# Patient Record
Sex: Female | Born: 1960 | Race: Black or African American | Hispanic: No | Marital: Married | Smoking: Never smoker
Health system: Southern US, Community
[De-identification: ages and names within clinical notes are randomized; demographics above are authoritative.]

## PROBLEM LIST (undated history)

## (undated) DIAGNOSIS — R059 Cough, unspecified: Secondary | ICD-10-CM

## (undated) DIAGNOSIS — R05 Cough: Secondary | ICD-10-CM

## (undated) DIAGNOSIS — R198 Other specified symptoms and signs involving the digestive system and abdomen: Secondary | ICD-10-CM

## (undated) DIAGNOSIS — E785 Hyperlipidemia, unspecified: Secondary | ICD-10-CM

## (undated) DIAGNOSIS — R09A2 Foreign body sensation, throat: Secondary | ICD-10-CM

## (undated) DIAGNOSIS — R070 Pain in throat: Secondary | ICD-10-CM

## (undated) DIAGNOSIS — R0989 Other specified symptoms and signs involving the circulatory and respiratory systems: Secondary | ICD-10-CM

## (undated) HISTORY — DX: Hyperlipidemia, unspecified: E78.5

## (undated) HISTORY — PX: DILATION AND CURETTAGE OF UTERUS: SHX78

## (undated) HISTORY — PX: DILATION AND CURETTAGE, DIAGNOSTIC / THERAPEUTIC: SUR384

## (undated) HISTORY — DX: Pain in throat: R07.0

## (undated) HISTORY — DX: Other specified symptoms and signs involving the digestive system and abdomen: R19.8

## (undated) HISTORY — DX: Other specified symptoms and signs involving the circulatory and respiratory systems: R09.89

## (undated) HISTORY — DX: Foreign body sensation, throat: R09.A2

---
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
## 1997-05-18 ENCOUNTER — Inpatient Hospital Stay (HOSPITAL_COMMUNITY): Admission: AD | Admit: 1997-05-18 | Discharge: 1997-05-18 | Payer: Self-pay | Admitting: *Deleted

## 1997-10-09 ENCOUNTER — Inpatient Hospital Stay (HOSPITAL_COMMUNITY): Admission: AD | Admit: 1997-10-09 | Discharge: 1997-10-11 | Payer: Self-pay | Admitting: Obstetrics and Gynecology

## 1997-10-19 ENCOUNTER — Encounter (HOSPITAL_COMMUNITY): Admission: RE | Admit: 1997-10-19 | Discharge: 1998-01-17 | Payer: Self-pay | Admitting: Obstetrics and Gynecology

## 1997-11-19 ENCOUNTER — Other Ambulatory Visit: Admission: RE | Admit: 1997-11-19 | Discharge: 1997-11-19 | Payer: Self-pay | Admitting: Obstetrics and Gynecology

## 1998-01-11 ENCOUNTER — Ambulatory Visit (HOSPITAL_COMMUNITY): Admission: RE | Admit: 1998-01-11 | Discharge: 1998-01-11 | Payer: Self-pay | Admitting: Obstetrics and Gynecology

## 1998-01-19 ENCOUNTER — Encounter (HOSPITAL_COMMUNITY): Admission: RE | Admit: 1998-01-19 | Discharge: 1998-04-19 | Payer: Self-pay | Admitting: *Deleted

## 1998-04-20 ENCOUNTER — Encounter (HOSPITAL_COMMUNITY): Admission: RE | Admit: 1998-04-20 | Discharge: 1998-07-19 | Payer: Self-pay | Admitting: *Deleted

## 1998-07-21 ENCOUNTER — Encounter (HOSPITAL_COMMUNITY): Admission: RE | Admit: 1998-07-21 | Discharge: 1998-10-19 | Payer: Self-pay | Admitting: Obstetrics and Gynecology

## 1999-02-02 ENCOUNTER — Encounter: Admission: RE | Admit: 1999-02-02 | Discharge: 1999-02-02 | Payer: Self-pay | Admitting: Obstetrics and Gynecology

## 1999-02-02 ENCOUNTER — Encounter: Payer: Self-pay | Admitting: Obstetrics and Gynecology

## 2001-01-03 ENCOUNTER — Other Ambulatory Visit: Admission: RE | Admit: 2001-01-03 | Discharge: 2001-01-03 | Payer: Self-pay | Admitting: Obstetrics and Gynecology

## 2002-01-27 ENCOUNTER — Other Ambulatory Visit: Admission: RE | Admit: 2002-01-27 | Discharge: 2002-01-27 | Payer: Self-pay | Admitting: Obstetrics and Gynecology

## 2005-01-12 ENCOUNTER — Encounter: Admission: RE | Admit: 2005-01-12 | Discharge: 2005-01-12 | Payer: Self-pay | Admitting: Obstetrics and Gynecology

## 2007-01-24 ENCOUNTER — Encounter: Admission: RE | Admit: 2007-01-24 | Discharge: 2007-01-24 | Payer: Self-pay | Admitting: Obstetrics and Gynecology

## 2008-03-02 ENCOUNTER — Encounter: Admission: RE | Admit: 2008-03-02 | Discharge: 2008-03-02 | Payer: Self-pay | Admitting: Obstetrics and Gynecology

## 2009-03-08 ENCOUNTER — Encounter: Admission: RE | Admit: 2009-03-08 | Discharge: 2009-03-08 | Payer: Self-pay | Admitting: Obstetrics and Gynecology

## 2009-08-26 ENCOUNTER — Encounter: Admission: RE | Admit: 2009-08-26 | Discharge: 2009-08-26 | Payer: Self-pay | Admitting: Obstetrics and Gynecology

## 2010-02-14 ENCOUNTER — Other Ambulatory Visit: Payer: Self-pay | Admitting: Obstetrics and Gynecology

## 2010-02-14 DIAGNOSIS — Z1239 Encounter for other screening for malignant neoplasm of breast: Secondary | ICD-10-CM

## 2010-03-09 ENCOUNTER — Ambulatory Visit
Admission: RE | Admit: 2010-03-09 | Discharge: 2010-03-09 | Disposition: A | Discharge status: Home / Self Care | Payer: Managed Care, Other (non HMO) | Source: Ambulatory Visit | Attending: Obstetrics and Gynecology | Admitting: Obstetrics and Gynecology

## 2010-03-09 DIAGNOSIS — Z1239 Encounter for other screening for malignant neoplasm of breast: Secondary | ICD-10-CM

## 2011-02-20 ENCOUNTER — Other Ambulatory Visit: Payer: Self-pay | Admitting: Obstetrics and Gynecology

## 2011-02-20 DIAGNOSIS — Z1231 Encounter for screening mammogram for malignant neoplasm of breast: Secondary | ICD-10-CM

## 2011-03-14 ENCOUNTER — Ambulatory Visit
Admission: RE | Admit: 2011-03-14 | Discharge: 2011-03-14 | Disposition: A | Discharge status: Home / Self Care | Payer: Managed Care, Other (non HMO) | Source: Ambulatory Visit | Attending: Obstetrics and Gynecology | Admitting: Obstetrics and Gynecology

## 2011-03-14 DIAGNOSIS — Z1231 Encounter for screening mammogram for malignant neoplasm of breast: Secondary | ICD-10-CM

## 2012-03-12 ENCOUNTER — Other Ambulatory Visit: Payer: Self-pay | Admitting: Obstetrics and Gynecology

## 2012-03-12 DIAGNOSIS — Z1231 Encounter for screening mammogram for malignant neoplasm of breast: Secondary | ICD-10-CM

## 2012-04-09 ENCOUNTER — Ambulatory Visit
Admission: RE | Admit: 2012-04-09 | Discharge: 2012-04-09 | Disposition: A | Discharge status: Home / Self Care | Payer: Private Health Insurance - Indemnity | Source: Ambulatory Visit | Attending: Obstetrics and Gynecology | Admitting: Obstetrics and Gynecology

## 2012-04-09 DIAGNOSIS — Z1231 Encounter for screening mammogram for malignant neoplasm of breast: Secondary | ICD-10-CM

## 2013-02-26 ENCOUNTER — Other Ambulatory Visit: Payer: Self-pay

## 2013-02-26 DIAGNOSIS — Z1231 Encounter for screening mammogram for malignant neoplasm of breast: Secondary | ICD-10-CM

## 2013-04-10 ENCOUNTER — Ambulatory Visit
Admission: RE | Admit: 2013-04-10 | Discharge: 2013-04-10 | Disposition: A | Discharge status: Home / Self Care | Payer: Self-pay | Source: Ambulatory Visit

## 2013-04-10 DIAGNOSIS — Z1231 Encounter for screening mammogram for malignant neoplasm of breast: Secondary | ICD-10-CM

## 2015-11-29 NOTE — Progress Notes (Signed)
 Cardiology Office Note    Date:  11/30/2015   ID:  Michele Mcdaniel, DOB 09/04/1960, MRN [id number]  PCP:  VIA, KEVIN, MD  Cardiologist:  New --> Dr. Varanasi.  CC: Chest pain  History of Present Illness:  Michele Mcdaniel is a 55 y.o. female with a history of HLD and no prior cardiac history who presents to clinic for evaluation of chest pain and palpitations.   She has not followed with a PCP for a while. Did follow with her OB/GYN, Dr. Taavon.   Established care with Dr Via with [facility] on 11/25/15 for chest pressure.   Labwork from that visit reviewed: H/H 13.9/41.9. Creat 0.86.NA 142, K 4.5, AST/ALT 11/12, TC 243, TG 63, HDL 83, LDL 147, TSH 1.2. ECG showed HR 63 NSR with no acute ST/TW changes.   She was in her usual state of health until about 6-9 months ago. She has noticed decreased exercise tolerance. She works with [employer] and would love to walk around the track after work or walk on her treadmill. She has noticed exertional chest burning and SOB that is worsening in frequency and severity. She feels like she cannot walk even small distances with out feeling SOB or experiencing chest pain. Sometimes is radiates to her right arm. No LE edema, orthopnea or PND. No dizziness or syncope. Sometimes she feels like her heart is beating "hard" when she gets the chest pain.    Past Medical History:  Diagnosis Date  . HLD (hyperlipidemia)     No past surgical history on file.  Current Medications: No outpatient prescriptions prior to visit.   No facility-administered medications prior to visit.      Allergies:   Patient has no known allergies.   Social History   Social History  . Marital status: Married    Spouse name: N/A  . Number of children: N/A  . Years of education: N/A   Social History Main Topics  . Smoking status: Not on file  . Smokeless tobacco: Not on file  . Alcohol use Not on file  . Drug use: Unknown  . Sexual activity:  Not on file   Other Topics Concern  . Not on file   Social History Narrative  . No narrative on file     Family History:  The patient's family history includes Heart attack in her father; Heart failure in her mother; Hypertension in her brother; Peripheral Artery Disease in her mother.     ROS:   Please see the history of present illness.    ROS All other systems reviewed and are negative.   PHYSICAL EXAM:   VS:  BP 118/72 (BP Location: Right Arm, Patient Position: Sitting, Cuff Size: Normal)   Pulse 70   Ht 5' 6.25" (1.683 m)   Wt 161 lb 1.9 oz (73.1 kg)   SpO2 95%   BMI 25.81 kg/m    GEN: Well nourished, well developed, in no acute distress  HEENT: normal  Neck: no JVD, carotid bruits, or masses Cardiac: RRR; no murmurs, rubs, or gallops,no edema  Respiratory:  clear to auscultation bilaterally, normal work of breathing GI: soft, nontender, nondistended, + BS MS: no deformity or atrophy  Skin: warm and dry, no rash Neuro:  Alert and Oriented x 3, Strength and sensation are intact Psych: euthymic mood, full affect  Wt Readings from Last 3 Encounters:  11/30/15 161 lb 1.9 oz (73.1 kg)      Studies/Labs Reviewed:     EKG:  EKG is ordered today.  The ekg ordered today demonstrates NSR   Recent Labs: No results found for requested labs within last 8760 hours.   Lipid Panel No results found for: CHOL, TRIG, HDL, CHOLHDL, VLDL, LDLCALC, LDLDIRECT  Additional studies/ records that were reviewed today include:  None    ASSESSMENT & PLAN:   Exertional chest pain and SOB: this is very concerning for cardiac angina. She has RFs including HLD and family history of CAD. ECG with no acute ST or TW changes. Discussed case with Dr. Varanasi who saw patient with me . We feel cardiac catheterization is indicated. Will start her on ASA, statin and imdur 30 mg daily. Cath planned for next Tuesday with Dr. Smith. If imdur does not improve her sx, I will start a BB.   I have  reviewed the risks, indications, and alternatives to cardiac catheterization and possible angioplasty/stenting with the patient. Risks include but are not limited to bleeding, infection, vascular injury, stroke, myocardial infection, arrhythmia, kidney injury, radiation-related injury in the case of prolonged fluoroscopy use, emergency cardiac surgery, and death. The patient understands the risks of serious complication is low (<1%).   I will get pre cath labs today. She may be a candidate for same day PCI.    HLD: LDL 146. If she does have CAD, goal <70. Will start atorva 80mg daily.   Total time spent with patient was over 40 minutes which included evaluating patient, reviewing record and coordinating care. Face to face time >50%. Discussed case with Dr. Varanasi, DOD. We discussed together and with patient.   Medication Adjustments/Labs and Tests Ordered: Current medicines are reviewed at length with the patient today.  Concerns regarding medicines are outlined above.  Medication changes, Labs and Tests ordered today are listed in the Patient Instructions below. Patient Instructions  Medication Instructions:  Your physician has recommended you make the following change in your medication:  1.  START Imdur 30 mg taking 1 tablet daily 2.  START Aspirin 81 mg taking 1 daily 3.  START Atorvastatin 80 mg taking 1 tablet daily  Labwork: TODAY:  BMET, CBC W/DIFF & PT/INR  Testing/Procedures: Your physician has requested that you have a cardiac catheterization. Cardiac catheterization is used to diagnose and/or treat various heart conditions. Doctors may recommend this procedure for a number of different reasons. The most common reason is to evaluate chest pain. Chest pain can be a symptom of coronary artery disease (CAD), and cardiac catheterization can show whether plaque is narrowing or blocking your heart's arteries. This procedure is also used to evaluate the valves, as well as measure the  blood flow and oxygen levels in different parts of your heart. For further information please visit [email or web address]. Please follow instruction sheet, as given.   Follow-Up: Your physician recommends that you schedule a follow-up appointment in:    Any Other Special Instructions Will Be Listed Below (If Applicable).    Coronary Angiogram With Stent Coronary angiogram with stent placement is a procedure to widen or open a narrow blood vessel of the heart (coronary artery). Arteries may become blocked by cholesterol buildup (plaques) in the lining or wall. When a coronary artery becomes partially blocked, blood flow to that area decreases. This may lead to chest pain or a heart attack (myocardial infarction). A stent is a small piece of metal that looks like mesh or a spring. Stent placement may be done as treatment for a heart attack or right after a coronary angiogram   in which a blocked artery is found. Let your health care provider know about:  Any allergies you have.  All medicines you are taking, including vitamins, herbs, eye drops, creams, and over-the-counter medicines.  Any problems you or family members have had with anesthetic medicines.  Any blood disorders you have.  Any surgeries you have had.  Any medical conditions you have.  Whether you are pregnant or may be pregnant. What are the risks? Generally, this is a safe procedure. However, problems may occur, including:  Damage to the heart or its blood vessels.  A return of blockage.  Bleeding, infection, or bruising at the insertion site.  A collection of blood under the skin (hematoma) at the insertion site.  A blood clot in another part of the body.  Kidney injury.  Allergic reaction to the dye or contrast that is used.  Bleeding into the abdomen (retroperitoneal bleeding). What happens before the procedure? Staying hydrated  Follow instructions from your health care provider about hydration, which may  include:  Up to 2 hours before the procedure - you may continue to drink clear liquids, such as water, clear fruit juice, black coffee, and plain tea. Eating and drinking restrictions  Follow instructions from your health care provider about eating and drinking, which may include:  8 hours before the procedure - stop eating heavy meals or foods such as meat, fried foods, or fatty foods.  6 hours before the procedure - stop eating light meals or foods, such as toast or cereal.  2 hours before the procedure - stop drinking clear liquids. Ask your health care provider about:  Changing or stopping your regular medicines. This is especially important if you are taking diabetes medicines or blood thinners.  Taking medicines such as ibuprofen. These medicines can thin your blood. Do not take these medicines before your procedure if your health care provider instructs you not to. Generally, aspirin is recommended before a procedure of passing a small, thin tube (catheter) through a blood vessel and into the heart (cardiac catheterization). What happens during the procedure?  An IV tube will be inserted into one of your veins.  You will be given one or more of the following:  A medicine to help you relax (sedative).  A medicine to numb the area where the catheter will be inserted into an artery (local anesthetic).  To reduce your risk of infection:  Your health care team will wash or sanitize their hands.  Your skin will be washed with soap.  Hair may be removed from the area where the catheter will be inserted.  Using a guide wire, the catheter will be inserted into an artery. The location may be in your groin, in your wrist, or in the fold of your arm (near your elbow).  A type of X-ray (fluoroscopy) will be used to help guide the catheter to the opening of the arteries in the heart.  A dye will be injected into the catheter, and X-rays will be taken. The dye will help to show where  any narrowing or blockages are located in the arteries.  A tiny wire will be guided to the blocked spot, and a balloon will be inflated to make the artery wider.  The stent will be expanded and will crush the plaques into the wall of the vessel. The stent will hold the area open and improve the blood flow. Most stents have a drug coating to reduce the risk of the stent narrowing over time.    The artery may be made wider using a drill, laser, or other tools to remove plaques.  When the blood flow is better, the catheter will be removed. The lining of the artery will grow over the stent, which stays where it was placed. This procedure may vary among health care providers and hospitals. What happens after the procedure?  If the procedure is done through the leg, you will be kept in bed lying flat for about 6 hours. You will be instructed to not bend and not cross your legs.  The insertion site will be checked frequently.  The pulse in your foot or wrist will be checked frequently.  You may have additional blood tests, X-rays, and a test that records the electrical activity of your heart (electrocardiogram, or ECG). This information is not intended to replace advice given to you by your health care provider. Make sure you discuss any questions you have with your health care provider. Document Released: 07/09/2002 Document Revised: 09/02/2015 Document Reviewed: 08/08/2015 Elsevier Interactive Patient Education  2017 [employer]     Signed, Ricardo Schubach, PA-C  11/30/2015 12:26 PM    [facility] [address], [place], [place]  [address] Phone: [phone]; Fax: [phone]    

## 2015-11-30 ENCOUNTER — Encounter: Payer: Self-pay | Admitting: Physician Assistant

## 2015-11-30 ENCOUNTER — Ambulatory Visit (INDEPENDENT_AMBULATORY_CARE_PROVIDER_SITE_OTHER): Payer: Private Health Insurance - Indemnity | Admitting: Physician Assistant

## 2015-11-30 ENCOUNTER — Encounter (INDEPENDENT_AMBULATORY_CARE_PROVIDER_SITE_OTHER): Payer: Self-pay

## 2015-11-30 ENCOUNTER — Encounter: Payer: Self-pay | Admitting: *Deleted

## 2015-11-30 VITALS — BP 118/72 | HR 70 | Ht 66.25 in | Wt 161.1 lb

## 2015-11-30 DIAGNOSIS — R079 Chest pain, unspecified: Secondary | ICD-10-CM | POA: Diagnosis not present

## 2015-11-30 DIAGNOSIS — E785 Hyperlipidemia, unspecified: Secondary | ICD-10-CM | POA: Diagnosis not present

## 2015-11-30 DIAGNOSIS — R0602 Shortness of breath: Secondary | ICD-10-CM

## 2015-11-30 LAB — CBC WITH DIFFERENTIAL/PLATELET
BASOS PCT: 0 %
Basophils Absolute: 0 cells/uL (ref 0–200)
EOS ABS: 51 {cells}/uL (ref 15–500)
Eosinophils Relative: 1 %
HEMATOCRIT: 41 % (ref 35.0–45.0)
Hemoglobin: 13.1 g/dL (ref 11.7–15.5)
Lymphocytes Relative: 46 %
Lymphs Abs: 2346 cells/uL (ref 850–3900)
MCH: 23.1 pg — ABNORMAL LOW (ref 27.0–33.0)
MCHC: 32 g/dL (ref 32.0–36.0)
MCV: 72.4 fL — ABNORMAL LOW (ref 80.0–100.0)
MONO ABS: 255 {cells}/uL (ref 200–950)
MPV: 9.3 fL (ref 7.5–12.5)
Monocytes Relative: 5 %
Neutro Abs: 2448 cells/uL (ref 1500–7800)
Neutrophils Relative %: 48 %
Platelets: 258 10*3/uL (ref 140–400)
RBC: 5.66 MIL/uL — AB (ref 3.80–5.10)
RDW: 16.6 % — ABNORMAL HIGH (ref 11.0–15.0)
WBC: 5.1 10*3/uL (ref 3.8–10.8)

## 2015-11-30 LAB — BASIC METABOLIC PANEL
BUN: 17 mg/dL (ref 7–25)
CHLORIDE: 105 mmol/L (ref 98–110)
CO2: 26 mmol/L (ref 20–31)
Calcium: 9.7 mg/dL (ref 8.6–10.4)
Creat: 0.82 mg/dL (ref 0.50–1.05)
GLUCOSE: 86 mg/dL (ref 65–99)
Potassium: 4.5 mmol/L (ref 3.5–5.3)
SODIUM: 141 mmol/L (ref 135–146)

## 2015-11-30 LAB — PROTIME-INR
INR: 1.1
Prothrombin Time: 11.2 s (ref 9.0–11.5)

## 2015-11-30 MED ORDER — ISOSORBIDE MONONITRATE ER 30 MG PO TB24: 30.0000 mg | ORAL_TABLET | Freq: Every day | ORAL | 3 refills | Status: DC

## 2015-11-30 MED ORDER — ASPIRIN EC 81 MG PO TBEC: 81.0000 mg | DELAYED_RELEASE_TABLET | Freq: Every day | ORAL | 3 refills | Status: DC

## 2015-11-30 MED ORDER — ATORVASTATIN CALCIUM 80 MG PO TABS: 80.0000 mg | ORAL_TABLET | Freq: Every day | ORAL | 3 refills | Status: DC

## 2015-11-30 NOTE — Patient Instructions (Addendum)
Medication Instructions:  Your physician has recommended you make the following change in your medication:  1.  START Imdur 30 mg taking 1 tablet daily 2.  START Aspirin 81 mg taking 1 daily 3.  START Atorvastatin 80 mg taking 1 tablet daily  Labwork: TODAY:  BMET, CBC W/DIFF & PT/INR  Testing/Procedures: Your physician has requested that you have a cardiac catheterization. Cardiac catheterization is used to diagnose and/or treat various heart conditions. Doctors may recommend this procedure for a number of different reasons. The most common reason is to evaluate chest pain. Chest pain can be a symptom of coronary artery disease (CAD), and cardiac catheterization can show whether plaque is narrowing or blocking your heart's arteries. This procedure is also used to evaluate the valves, as well as measure the blood flow and oxygen levels in different parts of your heart. For further information please visit [email or web address]. Please follow instruction sheet, as given.   Follow-Up: Your physician recommends that you schedule a follow-up appointment in:    Any Other Special Instructions Will Be Listed Below (If Applicable).    Coronary Angiogram With Stent Coronary angiogram with stent placement is a procedure to widen or open a narrow blood vessel of the heart (coronary artery). Arteries may become blocked by cholesterol buildup (plaques) in the lining or wall. When a coronary artery becomes partially blocked, blood flow to that area decreases. This may lead to chest pain or a heart attack (myocardial infarction). A stent is a small piece of metal that looks like mesh or a spring. Stent placement may be done as treatment for a heart attack or right after a coronary angiogram in which a blocked artery is found. Let your health care provider know about:  Any allergies you have.  All medicines you are taking, including vitamins, herbs, eye drops, creams, and over-the-counter medicines.  Any  problems you or family members have had with anesthetic medicines.  Any blood disorders you have.  Any surgeries you have had.  Any medical conditions you have.  Whether you are pregnant or may be pregnant. What are the risks? Generally, this is a safe procedure. However, problems may occur, including:  Damage to the heart or its blood vessels.  A return of blockage.  Bleeding, infection, or bruising at the insertion site.  A collection of blood under the skin (hematoma) at the insertion site.  A blood clot in another part of the body.  Kidney injury.  Allergic reaction to the dye or contrast that is used.  Bleeding into the abdomen (retroperitoneal bleeding). What happens before the procedure? Staying hydrated  Follow instructions from your health care provider about hydration, which may include:  Up to 2 hours before the procedure - you may continue to drink clear liquids, such as water, clear fruit juice, black coffee, and plain tea. Eating and drinking restrictions  Follow instructions from your health care provider about eating and drinking, which may include:  8 hours before the procedure - stop eating heavy meals or foods such as meat, fried foods, or fatty foods.  6 hours before the procedure - stop eating light meals or foods, such as toast or cereal.  2 hours before the procedure - stop drinking clear liquids. Ask your health care provider about:  Changing or stopping your regular medicines. This is especially important if you are taking diabetes medicines or blood thinners.  Taking medicines such as ibuprofen. These medicines can thin your blood. Do not take these medicines before your  procedure if your health care provider instructs you not to. Generally, aspirin is recommended before a procedure of passing a small, thin tube (catheter) through a blood vessel and into the heart (cardiac catheterization). What happens during the procedure?  An IV tube will be  inserted into one of your veins.  You will be given one or more of the following:  A medicine to help you relax (sedative).  A medicine to numb the area where the catheter will be inserted into an artery (local anesthetic).  To reduce your risk of infection:  Your health care team will wash or sanitize their hands.  Your skin will be washed with soap.  Hair may be removed from the area where the catheter will be inserted.  Using a guide wire, the catheter will be inserted into an artery. The location may be in your groin, in your wrist, or in the fold of your arm (near your elbow).  A type of X-ray (fluoroscopy) will be used to help guide the catheter to the opening of the arteries in the heart.  A dye will be injected into the catheter, and X-rays will be taken. The dye will help to show where any narrowing or blockages are located in the arteries.  A tiny wire will be guided to the blocked spot, and a balloon will be inflated to make the artery wider.  The stent will be expanded and will crush the plaques into the wall of the vessel. The stent will hold the area open and improve the blood flow. Most stents have a drug coating to reduce the risk of the stent narrowing over time.  The artery may be made wider using a drill, laser, or other tools to remove plaques.  When the blood flow is better, the catheter will be removed. The lining of the artery will grow over the stent, which stays where it was placed. This procedure may vary among health care providers and hospitals. What happens after the procedure?  If the procedure is done through the leg, you will be kept in bed lying flat for about 6 hours. You will be instructed to not bend and not cross your legs.  The insertion site will be checked frequently.  The pulse in your foot or wrist will be checked frequently.  You may have additional blood tests, X-rays, and a test that records the electrical activity of your heart  (electrocardiogram, or ECG). This information is not intended to replace advice given to you by your health care provider. Make sure you discuss any questions you have with your health care provider. Document Released: 07/09/2002 Document Revised: 09/02/2015 Document Reviewed: 08/08/2015 Elsevier Interactive Patient Education  2017 [employer]

## 2015-12-07 ENCOUNTER — Ambulatory Visit (HOSPITAL_COMMUNITY)
Admission: RE | Admit: 2015-12-07 | Discharge: 2015-12-07 | Disposition: A | Discharge status: Home / Self Care | Payer: Managed Care, Other (non HMO) | Source: Ambulatory Visit | Attending: Interventional Cardiology | Admitting: Interventional Cardiology

## 2015-12-07 ENCOUNTER — Encounter (HOSPITAL_COMMUNITY)
Admission: RE | Disposition: A | Discharge status: Home / Self Care | Payer: Self-pay | Source: Ambulatory Visit | Attending: Interventional Cardiology

## 2015-12-07 ENCOUNTER — Encounter (HOSPITAL_COMMUNITY): Payer: Self-pay | Admitting: *Deleted

## 2015-12-07 DIAGNOSIS — I200 Unstable angina: Secondary | ICD-10-CM

## 2015-12-07 DIAGNOSIS — R0602 Shortness of breath: Secondary | ICD-10-CM | POA: Insufficient documentation

## 2015-12-07 DIAGNOSIS — R002 Palpitations: Secondary | ICD-10-CM | POA: Diagnosis not present

## 2015-12-07 DIAGNOSIS — Z8249 Family history of ischemic heart disease and other diseases of the circulatory system: Secondary | ICD-10-CM | POA: Diagnosis not present

## 2015-12-07 DIAGNOSIS — E785 Hyperlipidemia, unspecified: Secondary | ICD-10-CM | POA: Insufficient documentation

## 2015-12-07 DIAGNOSIS — R079 Chest pain, unspecified: Secondary | ICD-10-CM

## 2015-12-07 DIAGNOSIS — R0789 Other chest pain: Secondary | ICD-10-CM | POA: Diagnosis not present

## 2015-12-07 HISTORY — PX: CARDIAC CATHETERIZATION: SHX172

## 2015-12-07 SURGERY — LEFT HEART CATH AND CORONARY ANGIOGRAPHY
Anesthesia: LOCAL

## 2015-12-07 MED ORDER — HEPARIN SODIUM (PORCINE) 1000 UNIT/ML IJ SOLN
INTRAMUSCULAR | Status: DC | PRN
  Administered 2015-12-07: 3500 [IU] via INTRAVENOUS

## 2015-12-07 MED ORDER — MIDAZOLAM HCL 2 MG/2ML IJ SOLN
INTRAMUSCULAR | Status: DC | PRN
  Administered 2015-12-07 (×2): 1 mg via INTRAVENOUS

## 2015-12-07 MED ORDER — HEPARIN SODIUM (PORCINE) 1000 UNIT/ML IJ SOLN
INTRAMUSCULAR | Status: AC
  Filled 2015-12-07: qty 1

## 2015-12-07 MED ORDER — LIDOCAINE HCL (PF) 1 % IJ SOLN
INTRAMUSCULAR | Status: AC
  Filled 2015-12-07: qty 30

## 2015-12-07 MED ORDER — VERAPAMIL HCL 2.5 MG/ML IV SOLN
INTRAVENOUS | Status: AC
  Filled 2015-12-07: qty 2

## 2015-12-07 MED ORDER — HEPARIN (PORCINE) IN NACL 2-0.9 UNIT/ML-% IJ SOLN
INTRAMUSCULAR | Status: AC
  Filled 2015-12-07: qty 1000

## 2015-12-07 MED ORDER — LIDOCAINE HCL (PF) 1 % IJ SOLN
INTRAMUSCULAR | Status: DC | PRN
  Administered 2015-12-07: 2 mL via INTRADERMAL

## 2015-12-07 MED ORDER — SODIUM CHLORIDE 0.9% FLUSH: 3.0000 mL | Freq: Two times a day (BID) | INTRAVENOUS | Status: DC

## 2015-12-07 MED ORDER — SODIUM CHLORIDE 0.9 % WEIGHT BASED INFUSION
3.0000 mL/kg/h | INTRAVENOUS | Status: AC
Start: 2015-12-07 — End: 2015-12-07
  Administered 2015-12-07: 3 mL/kg/h via INTRAVENOUS

## 2015-12-07 MED ORDER — IOPAMIDOL (ISOVUE-370) INJECTION 76%
INTRAVENOUS | Status: AC
  Filled 2015-12-07: qty 100

## 2015-12-07 MED ORDER — MIDAZOLAM HCL 2 MG/2ML IJ SOLN
INTRAMUSCULAR | Status: AC
  Filled 2015-12-07: qty 2

## 2015-12-07 MED ORDER — FENTANYL CITRATE (PF) 100 MCG/2ML IJ SOLN
INTRAMUSCULAR | Status: AC
  Filled 2015-12-07: qty 2

## 2015-12-07 MED ORDER — HEPARIN (PORCINE) IN NACL 2-0.9 UNIT/ML-% IJ SOLN
INTRAMUSCULAR | Status: DC | PRN
  Administered 2015-12-07: 1000 mL via INTRA_ARTERIAL

## 2015-12-07 MED ORDER — SODIUM CHLORIDE 0.9 % IV SOLN: 250.0000 mL | INTRAVENOUS | Status: DC | PRN

## 2015-12-07 MED ORDER — FENTANYL CITRATE (PF) 100 MCG/2ML IJ SOLN
INTRAMUSCULAR | Status: DC | PRN
  Administered 2015-12-07: 25 ug via INTRAVENOUS
  Administered 2015-12-07: 50 ug via INTRAVENOUS

## 2015-12-07 MED ORDER — IOPAMIDOL (ISOVUE-370) INJECTION 76%
INTRAVENOUS | Status: DC | PRN
  Administered 2015-12-07: 55 mL via INTRA_ARTERIAL

## 2015-12-07 MED ORDER — HEPARIN (PORCINE) IN NACL 2-0.9 UNIT/ML-% IJ SOLN
INTRAMUSCULAR | Status: DC | PRN
  Administered 2015-12-07: 10 mL via INTRA_ARTERIAL

## 2015-12-07 MED ORDER — SODIUM CHLORIDE 0.9% FLUSH: 3.0000 mL | INTRAVENOUS | Status: DC | PRN

## 2015-12-07 MED ORDER — ASPIRIN 81 MG PO CHEW: 81.0000 mg | CHEWABLE_TABLET | ORAL | Status: DC

## 2015-12-07 MED ORDER — SODIUM CHLORIDE 0.9 % WEIGHT BASED INFUSION: 1.0000 mL/kg/h | INTRAVENOUS | Status: DC

## 2015-12-07 SURGICAL SUPPLY — 10 items
CATH INFINITI 5 FR JL3.5 (CATHETERS) ×2 IMPLANT
CATH INFINITI JR4 5F (CATHETERS) ×2 IMPLANT
DEVICE RAD COMP TR BAND LRG (VASCULAR PRODUCTS) ×2 IMPLANT
GLIDESHEATH SLEND A-KIT 6F 22G (SHEATH) ×2 IMPLANT
GUIDEWIRE INQWIRE 1.5J.035X260 (WIRE) ×1 IMPLANT
INQWIRE 1.5J .035X260CM (WIRE) ×2
KIT HEART LEFT (KITS) ×2 IMPLANT
PACK CARDIAC CATHETERIZATION (CUSTOM PROCEDURE TRAY) ×2 IMPLANT
TRANSDUCER W/STOPCOCK (MISCELLANEOUS) ×2 IMPLANT
TUBING CIL FLEX 10 FLL-RA (TUBING) ×2 IMPLANT

## 2015-12-07 NOTE — H&P (View-Only) (Signed)
Cardiology Office Note    Date:  11/30/2015   ID:  Michele MarseillesAngela Mcdaniel, DOB 1960/03/14, MRN [id number]  PCP:  Iva BoopVIA, KEVIN, MD  Cardiologist:  New --> Dr. Eldridge DaceVaranasi.  CC: Chest pain  History of Present Illness:  Michele Mcdaniel is a 55 y.o. female with a history of HLD and no prior cardiac history who presents to clinic for evaluation of chest pain and palpitations.   She has not followed with a PCP for a while. Did follow with her OB/GYN, Dr. Billy Coastaavon.   Established care with Dr Via with [facility] on 11/25/15 for chest pressure.   Labwork from that visit reviewed: H/H 13.9/41.9. Creat 0.86.NA 142, K 4.5, AST/ALT 11/12, TC 243, TG 63, HDL 83, LDL 147, TSH 1.2. ECG showed HR 63 NSR with no acute ST/TW changes.   She was in her usual state of health until about 6-9 months ago. She has noticed decreased exercise tolerance. She works with [employer] [place] and would love to walk around the track after work or walk on her treadmill. She has noticed exertional chest burning and SOB that is worsening in frequency and severity. She feels like she cannot walk even small distances with out feeling SOB or experiencing chest pain. Sometimes is radiates to her right arm. No LE edema, orthopnea or PND. No dizziness or syncope. Sometimes she feels like her heart is beating "hard" when she gets the chest pain.    Past Medical History:  Diagnosis Date  . HLD (hyperlipidemia)     No past surgical history on file.  Current Medications: No outpatient prescriptions prior to visit.   No facility-administered medications prior to visit.      Allergies:   Patient has no known allergies.   Social History   Social History  . Marital status: Married    Spouse name: N/A  . Number of children: N/A  . Years of education: N/A   Social History Main Topics  . Smoking status: Not on file  . Smokeless tobacco: Not on file  . Alcohol use Not on file  . Drug use: Unknown  . Sexual activity:  Not on file   Other Topics Concern  . Not on file   Social History Narrative  . No narrative on file     Family History:  The patient's family history includes Heart attack in her father; Heart failure in her mother; Hypertension in her brother; Peripheral Artery Disease in her mother.     ROS:   Please see the history of present illness.    ROS All other systems reviewed and are negative.   PHYSICAL EXAM:   VS:  BP 118/72 (BP Location: Right Arm, Patient Position: Sitting, Cuff Size: Normal)   Pulse 70   Ht 5' 6.25" (1.683 m)   Wt 161 lb 1.9 oz (73.1 kg)   SpO2 95%   BMI 25.81 kg/m    GEN: Well nourished, well developed, in no acute distress  HEENT: normal  Neck: no JVD, carotid bruits, or masses Cardiac: RRR; no murmurs, rubs, or gallops,no edema  Respiratory:  clear to auscultation bilaterally, normal work of breathing GI: soft, nontender, nondistended, + BS MS: no deformity or atrophy  Skin: warm and dry, no rash Neuro:  Alert and Oriented x 3, Strength and sensation are intact Psych: euthymic mood, full affect  Wt Readings from Last 3 Encounters:  11/30/15 161 lb 1.9 oz (73.1 kg)      Studies/Labs Reviewed:  EKG:  EKG is ordered today.  The ekg ordered today demonstrates NSR   Recent Labs: No results found for requested labs within last 8760 hours.   Lipid Panel No results found for: CHOL, TRIG, HDL, CHOLHDL, VLDL, LDLCALC, LDLDIRECT  Additional studies/ records that were reviewed today include:  None    ASSESSMENT & PLAN:   Exertional chest pain and SOB: this is very concerning for cardiac angina. She has RFs including HLD and family history of CAD. ECG with no acute ST or TW changes. Discussed case with Dr. Eldridge Dace who saw patient with me . We feel cardiac catheterization is indicated. Will start her on ASA, statin and imdur 30 mg daily. Cath planned for next Tuesday with Dr. Katrinka Blazing. If imdur does not improve her sx, I will start a BB.   I have  reviewed the risks, indications, and alternatives to cardiac catheterization and possible angioplasty/stenting with the patient. Risks include but are not limited to bleeding, infection, vascular injury, stroke, myocardial infection, arrhythmia, kidney injury, radiation-related injury in the case of prolonged fluoroscopy use, emergency cardiac surgery, and death. The patient understands the risks of serious complication is low (<1%).   I will get pre cath labs today. She may be a candidate for same day PCI.    HLD: LDL 146. If she does have CAD, goal <70. Will start atorva 80mg  daily.   Total time spent with patient was over 40 minutes which included evaluating patient, reviewing record and coordinating care. Face to face time >50%. Discussed case with Dr. Eldridge Dace, DOD. We discussed together and with patient.   Medication Adjustments/Labs and Tests Ordered: Current medicines are reviewed at length with the patient today.  Concerns regarding medicines are outlined above.  Medication changes, Labs and Tests ordered today are listed in the Patient Instructions below. Patient Instructions  Medication Instructions:  Your physician has recommended you make the following change in your medication:  1.  START Imdur 30 mg taking 1 tablet daily 2.  START Aspirin 81 mg taking 1 daily 3.  START Atorvastatin 80 mg taking 1 tablet daily  Labwork: TODAY:  BMET, CBC W/DIFF & PT/INR  Testing/Procedures: Your physician has requested that you have a cardiac catheterization. Cardiac catheterization is used to diagnose and/or treat various heart conditions. Doctors may recommend this procedure for a number of different reasons. The most common reason is to evaluate chest pain. Chest pain can be a symptom of coronary artery disease (CAD), and cardiac catheterization can show whether plaque is narrowing or blocking your heart's arteries. This procedure is also used to evaluate the valves, as well as measure the  blood flow and oxygen levels in different parts of your heart. For further information please visit [email or web address] Please follow instruction sheet, as given.   Follow-Up: Your physician recommends that you schedule a follow-up appointment in:    Any Other Special Instructions Will Be Listed Below (If Applicable).    Coronary Angiogram With Stent Coronary angiogram with stent placement is a procedure to widen or open a narrow blood vessel of the heart (coronary artery). Arteries may become blocked by cholesterol buildup (plaques) in the lining or wall. When a coronary artery becomes partially blocked, blood flow to that area decreases. This may lead to chest pain or a heart attack (myocardial infarction). A stent is a small piece of metal that looks like mesh or a spring. Stent placement may be done as treatment for a heart attack or right after a coronary angiogram  in which a blocked artery is found. Let your health care provider know about:  Any allergies you have.  All medicines you are taking, including vitamins, herbs, eye drops, creams, and over-the-counter medicines.  Any problems you or family members have had with anesthetic medicines.  Any blood disorders you have.  Any surgeries you have had.  Any medical conditions you have.  Whether you are pregnant or may be pregnant. What are the risks? Generally, this is a safe procedure. However, problems may occur, including:  Damage to the heart or its blood vessels.  A return of blockage.  Bleeding, infection, or bruising at the insertion site.  A collection of blood under the skin (hematoma) at the insertion site.  A blood clot in another part of the body.  Kidney injury.  Allergic reaction to the dye or contrast that is used.  Bleeding into the abdomen (retroperitoneal bleeding). What happens before the procedure? Staying hydrated  Follow instructions from your health care provider about hydration, which may  include:  Up to 2 hours before the procedure - you may continue to drink clear liquids, such as water, clear fruit juice, black coffee, and plain tea. Eating and drinking restrictions  Follow instructions from your health care provider about eating and drinking, which may include:  8 hours before the procedure - stop eating heavy meals or foods such as meat, fried foods, or fatty foods.  6 hours before the procedure - stop eating light meals or foods, such as toast or cereal.  2 hours before the procedure - stop drinking clear liquids. Ask your health care provider about:  Changing or stopping your regular medicines. This is especially important if you are taking diabetes medicines or blood thinners.  Taking medicines such as ibuprofen. These medicines can thin your blood. Do not take these medicines before your procedure if your health care provider instructs you not to. Generally, aspirin is recommended before a procedure of passing a small, thin tube (catheter) through a blood vessel and into the heart (cardiac catheterization). What happens during the procedure?  An IV tube will be inserted into one of your veins.  You will be given one or more of the following:  A medicine to help you relax (sedative).  A medicine to numb the area where the catheter will be inserted into an artery (local anesthetic).  To reduce your risk of infection:  Your health care team will wash or sanitize their hands.  Your skin will be washed with soap.  Hair may be removed from the area where the catheter will be inserted.  Using a guide wire, the catheter will be inserted into an artery. The location may be in your groin, in your wrist, or in the fold of your arm (near your elbow).  A type of X-ray (fluoroscopy) will be used to help guide the catheter to the opening of the arteries in the heart.  A dye will be injected into the catheter, and X-rays will be taken. The dye will help to show where  any narrowing or blockages are located in the arteries.  A tiny wire will be guided to the blocked spot, and a balloon will be inflated to make the artery wider.  The stent will be expanded and will crush the plaques into the wall of the vessel. The stent will hold the area open and improve the blood flow. Most stents have a drug coating to reduce the risk of the stent narrowing over time.  The artery may be made wider using a drill, laser, or other tools to remove plaques.  When the blood flow is better, the catheter will be removed. The lining of the artery will grow over the stent, which stays where it was placed. This procedure may vary among health care providers and hospitals. What happens after the procedure?  If the procedure is done through the leg, you will be kept in bed lying flat for about 6 hours. You will be instructed to not bend and not cross your legs.  The insertion site will be checked frequently.  The pulse in your foot or wrist will be checked frequently.  You may have additional blood tests, X-rays, and a test that records the electrical activity of your heart (electrocardiogram, or ECG). This information is not intended to replace advice given to you by your health care provider. Make sure you discuss any questions you have with your health care provider. Document Released: 07/09/2002 Document Revised: 09/02/2015 Document Reviewed: 08/08/2015 Elsevier Interactive Patient Education  [address]     Signed, Cline Crock, [place]  11/30/2015 12:26 PM    [facility] [address] [place], [place], [place]  [address] Phone: [phone]; Fax: [phone]

## 2015-12-07 NOTE — Discharge Instructions (Signed)

## 2015-12-07 NOTE — Interval H&P Note (Signed)
Cath Lab Visit (complete for each Cath Lab visit)  Clinical Evaluation Leading to the Procedure:   ACS: No.  Non-ACS:    Anginal Classification: CCS III  Anti-ischemic medical therapy: Minimal Therapy (1 class of medications)  Non-Invasive Test Results: No non-invasive testing performed  Prior CABG: No previous CABG      History and Physical Interval Note:  12/07/2015 1:48 PM  Michele Mcdaniel  has presented today for surgery, with the diagnosis of cp, sob  The various methods of treatment have been discussed with the patient and family. After consideration of risks, benefits and other options for treatment, the patient has consented to  Procedure(s): Left Heart Cath and Coronary Angiography (N/A) as a surgical intervention .  The patient's history has been reviewed, patient examined, no change in status, stable for surgery.  I have reviewed the patient's chart and labs.  Questions were answered to the patient's satisfaction.     Lyn RecordsHenry W Mariella Blackwelder III

## 2015-12-07 NOTE — Research (Signed)
CADLAD Informed Consent   Subject Name: Rosabella Edgin  Subject met inclusion and exclusion criteria.  The informed consent form, study requirements and expectations were reviewed with the subject and questions and concerns were addressed prior to the signing of the consent form.  The subject verbalized understanding of the trail requirements.  The subject agreed to participate in the CADLAD trial and signed the informed consent.  The informed consent was obtained prior to performance of any protocol-specific procedures for the subject.  A copy of the signed informed consent was given to the subject and a copy was placed in the subject's medical record.  Jimmy Picket 12/07/2015, 11:15

## 2015-12-08 ENCOUNTER — Encounter (HOSPITAL_COMMUNITY): Payer: Self-pay | Admitting: Interventional Cardiology

## 2015-12-14 ENCOUNTER — Encounter: Payer: Self-pay | Admitting: Physician Assistant

## 2015-12-18 NOTE — Progress Notes (Addendum)
Cardiology Office Note    Date:  12/21/2015   ID:  Michele Mcdaniel, DOB Nov 19, 1960, MRN [id number]  PCP:  Iva BoopVIA, KEVIN, MD  Cardiologist:  Dr. Eldridge DaceVaranasi  CC: post cath follow up   History of Present Illness:  Michele Mcdaniel is a 55 y.o. female with a history of HLD and mild non obst CAD who presents to clinic for follow up.    I saw her in clinic as a new patient on 11/30/15 for evaluation of exertional chest pain. Her symptoms were concerning for angina. Dr Eldridge Dacevaranasi saw her with me and we started her on ASA, statin and imdur. She was set up with coronary angiography on 12/07/15 that showed normal coronary arteries with 25% prox-mid LAD and normal LV function. Dr. Katrinka BlazingSmith felt like other causes of chest pain should be ruled out with anxiety being a diagnosis of exclusion.   Today she presents to clinic for follow up. Still having chest pain. She stopped taking the imdur due to headaches. She still continues to exercise. It does feel a little better. No LE edema, orthopnea or PND. She thinks it may a lot stress with her autistic son and brother in law who has spina bifida.      Past Medical History:  Diagnosis Date  . HLD (hyperlipidemia)     Past Surgical History:  Procedure Laterality Date  . CARDIAC CATHETERIZATION N/A 12/07/2015   Procedure: Left Heart Cath and Coronary Angiography;  Surgeon: Lyn RecordsHenry W Smith, MD;  [facility];  Service: Cardiovascular;  Laterality: N/A;  . DILATION AND CURETTAGE, DIAGNOSTIC / THERAPEUTIC      Current Medications: Outpatient Medications Prior to Visit  Medication Sig Dispense Refill  . aspirin EC 81 MG tablet Take 1 tablet (81 mg total) by mouth daily. 90 tablet 3  . atorvastatin (LIPITOR) 80 MG tablet Take 1 tablet (80 mg total) by mouth daily. 90 tablet 3  . calcium carbonate (CALTRATE 600) 1500 (600 Ca) MG TABS tablet Take 1,500 mg by mouth 2 (two) times daily with a meal.    . Multiple Vitamins-Minerals (ONE-A-DAY  50 PLUS PO) Take 1 tablet by mouth daily.    . Omega-3 Fatty Acids (FISH OIL) 1000 MG CAPS Take 2,000 mg by mouth daily.     . Soft Lens Products ([employer] CONTACT LENS RELIEF/REWET) SOLN Apply 1 drop to eye 3 (three) times daily as needed (for dry eyes).    . isosorbide mononitrate (IMDUR) 30 MG 24 hr tablet Take 1 tablet (30 mg total) by mouth daily. 90 tablet 3   No facility-administered medications prior to visit.      Allergies:   Patient has no known allergies.   Social History   Social History  . Marital status: Married    Spouse name: N/A  . Number of children: N/A  . Years of education: N/A   Social History Main Topics  . Smoking status: Never Smoker  . Smokeless tobacco: Never Used  . Alcohol use No  . Drug use: No  . Sexual activity: Not Asked   Other Topics Concern  . None   Social History Narrative  . None     Family History:  The patient's family history includes Heart attack in her father; Heart failure in her mother; Hypertension in her brother; Peripheral Artery Disease in her mother.     ROS:   Please see the history of present illness.    ROS All other systems reviewed and are negative.  PHYSICAL EXAM:   VS:  BP 126/88   Pulse 78   Ht 5' 6.25" (1.683 m)   Wt 164 lb 12.8 oz (74.8 kg)   BMI 26.40 kg/m    GEN: Well nourished, well developed, in no acute distress  HEENT: normal  Neck: no JVD, carotid bruits, or masses Cardiac: RRR; no murmurs, rubs, or gallops,no edema  Respiratory:  clear to auscultation bilaterally, normal work of breathing GI: soft, nontender, nondistended, + BS MS: no deformity or atrophy  Skin: warm and dry, no rash Neuro:  Alert and Oriented x 3, Strength and sensation are intact Psych: euthymic mood, full affect  Wt Readings from Last 3 Encounters:  12/21/15 164 lb 12.8 oz (74.8 kg)  12/07/15 159 lb (72.1 kg)  11/30/15 161 lb 1.9 oz (73.1 kg)      Studies/Labs Reviewed:   EKG:  EKG is NOT ordered today.  Recent  Labs: 11/30/2015: BUN 17; Creat 0.82; Hemoglobin 13.1; Platelets 258; Potassium 4.5; Sodium 141   Lipid Panel No results found for: CHOL, TRIG, HDL, CHOLHDL, VLDL, LDLCALC, LDLDIRECT  Additional studies/ records that were reviewed today include:  12/07/15 Conclusion    The left ventricular ejection fraction is 55-65% by visual estimate.  LV end diastolic pressure is normal.  Mid LAD to Dist LAD lesion, 25 %stenosed.    Widely patent coronary arteries. Irregularities with up to 25% narrowing is noted in the proximal to mid LAD.  Normal left ventricular systolic function with EF 65%.  RECOMMENDATIONS:   Investigate alternative explanations for exertional chest discomfort. Musculoskeletal etiology, pleural etiology, and pulmonary etiology should be considered. Anxiety may be a diagnoses of exclusion. She was very concerned that she had heart trouble like her parents and brothers.      ASSESSMENT & PLAN:   Chest pain: still having some but not as bad. No obstructive CAD as above. I offered her a referral to GI or PPI trail but she would like to continue to monitor at this time, which I think is totally reasonable. She does have a lot of stress caring for her autistic son and brother in law with spina bifida who both live with her.   Non obstructive CAD: continue ASA and statin.   HLD: LDL 146. Continue atorva 80mg  daily.   Medication Adjustments/Labs and Tests Ordered: Current medicines are reviewed at length with the patient today.  Concerns regarding medicines are outlined above.  Medication changes, Labs and Tests ordered today are listed in the Patient Instructions below. Patient Instructions  Medication Instructions:  Your physician recommends that you continue on your current medications as directed. Please refer to the Current Medication list given to you today.   Labwork: None ordered  Testing/Procedures: None ordered  Follow-Up: Your physician wants you to  follow-up in: 6 MONTHS WITH DR. VARANASI   You will receive a reminder letter in the mail two months in advance. If you don't receive a letter, please call our office to schedule the follow-up appointment.    Any Other Special Instructions Will Be Listed Below (If Applicable).   If you need a refill on your cardiac medications before your next appointment, please call your pharmacy.      Signed, Cline CrockKathryn Kenard Morawski, PA-C  12/21/2015 8:17 AM    [facility] [address], [place], [place]  [address] Phone: [phone]; Fax: [phone]

## 2015-12-21 ENCOUNTER — Ambulatory Visit (INDEPENDENT_AMBULATORY_CARE_PROVIDER_SITE_OTHER): Payer: Private Health Insurance - Indemnity | Admitting: Physician Assistant

## 2015-12-21 ENCOUNTER — Encounter (INDEPENDENT_AMBULATORY_CARE_PROVIDER_SITE_OTHER): Payer: Self-pay

## 2015-12-21 ENCOUNTER — Encounter: Payer: Self-pay | Admitting: Physician Assistant

## 2015-12-21 VITALS — BP 126/88 | HR 78 | Ht 66.25 in | Wt 164.8 lb

## 2015-12-21 DIAGNOSIS — I25119 Atherosclerotic heart disease of native coronary artery with unspecified angina pectoris: Secondary | ICD-10-CM

## 2015-12-21 DIAGNOSIS — E785 Hyperlipidemia, unspecified: Secondary | ICD-10-CM

## 2015-12-21 DIAGNOSIS — R079 Chest pain, unspecified: Secondary | ICD-10-CM

## 2015-12-21 NOTE — Patient Instructions (Addendum)

## 2017-04-11 DIAGNOSIS — E785 Hyperlipidemia, unspecified: Secondary | ICD-10-CM | POA: Diagnosis not present

## 2017-04-11 DIAGNOSIS — Z0000 Encounter for general adult medical examination without abnormal findings: Secondary | ICD-10-CM | POA: Diagnosis not present

## 2017-04-11 DIAGNOSIS — Z23 Encounter for immunization: Secondary | ICD-10-CM | POA: Diagnosis not present

## 2017-05-30 DIAGNOSIS — Z6823 Body mass index (BMI) 23.0-23.9, adult: Secondary | ICD-10-CM | POA: Diagnosis not present

## 2017-05-30 DIAGNOSIS — Z1231 Encounter for screening mammogram for malignant neoplasm of breast: Secondary | ICD-10-CM | POA: Diagnosis not present

## 2017-05-30 DIAGNOSIS — Z01419 Encounter for gynecological examination (general) (routine) without abnormal findings: Secondary | ICD-10-CM | POA: Diagnosis not present

## 2017-11-27 DIAGNOSIS — R079 Chest pain, unspecified: Secondary | ICD-10-CM | POA: Diagnosis not present

## 2017-12-11 ENCOUNTER — Ambulatory Visit: Payer: Private Health Insurance - Indemnity | Admitting: Interventional Cardiology

## 2017-12-14 NOTE — Progress Notes (Signed)
Cardiology Office Note   Date:  12/17/2017   ID:  Michele Mcdaniel, DOB [birth year], MRN [id number]  PCP:  Iva BoopVia, Kevin, MD    No chief complaint on file.  Exertional chest pain  Wt Readings from Last 3 Encounters:  12/17/17 164 lb 9.6 oz (74.7 kg)  12/21/15 164 lb 12.8 oz (74.8 kg)  12/07/15 159 lb (72.1 kg)       History of Present Illness: Michele Mcdaniel is a 57 y.o. female  with a history of HLD and mild non obst CAD who presents to clinic for follow up.    She was seen as a new patient on 11/30/15 for evaluation of exertional chest pain. Her symptoms were concerning for angina. We started her on ASA, statin and imdur. She was set up with coronary angiography on 12/07/15 that showed normal coronary arteries with 25% prox-mid LAD and normal LV function. Dr. Katrinka BlazingSmith felt like other causes of chest pain should be ruled out with anxiety being a diagnosis of exclusion.   She continue to have chest pain after the cath.  She stopped imdur due to headaches. Referral to GI or PPI trail was considered but she wanted to continue to monitor at that time.  She has a lot stress with her autistic son and brother in law who has spina bifida.   Since the last visit, she has had some chest pain with walking up a hill.  It can affect her right arm.  She repeatedly states that "this is ridiculous," when speaking of her sx.      Past Medical History:  Diagnosis Date  . HLD (hyperlipidemia)     Past Surgical History:  Procedure Laterality Date  . CARDIAC CATHETERIZATION N/A 12/07/2015   Procedure: Left Heart Cath and Coronary Angiography;  Surgeon: Lyn RecordsHenry W Smith, MD;  [facility];  Service: Cardiovascular;  Laterality: N/A;  . DILATION AND CURETTAGE, DIAGNOSTIC / THERAPEUTIC       Current Outpatient Medications  Medication Sig Dispense Refill  . aspirin EC 81 MG tablet Take 1 tablet (81 mg total) by mouth daily. 90 tablet 3  . calcium carbonate (CALTRATE  600) 1500 (600 Ca) MG TABS tablet Take 1,500 mg by mouth 2 (two) times daily with a meal.    . Multiple Vitamins-Minerals (ONE-A-DAY 50 PLUS PO) Take 1 tablet by mouth daily.    . Omega-3 Fatty Acids (FISH OIL) 1000 MG CAPS Take 2,000 mg by mouth daily.     . Soft Lens Products ([employer] CONTACT LENS RELIEF/REWET) SOLN Apply 1 drop to eye 3 (three) times daily as needed (for dry eyes).     No current facility-administered medications for this visit.     Allergies:   Patient has no known allergies.    Social History:  The patient  reports that she has never smoked. She has never used smokeless tobacco. She reports that she does not drink alcohol or use drugs.   Family History:  The patient's family history includes Heart attack in her father; Heart failure in her mother; Hypertension in her brother; Peripheral Artery Disease in her mother.    ROS:  Please see the history of present illness.   Otherwise, review of systems are positive for chest pain.   All other systems are reviewed and negative.    PHYSICAL EXAM: VS:  BP 140/80   Pulse 77   Ht 5' 6.25" (1.683 m)   Wt 164 lb 9.6 oz (74.7 kg)  SpO2 94%   BMI 26.37 kg/m  , BMI Body mass index is 26.37 kg/m. GEN: Well nourished, well developed, in no acute distress  HEENT: normal  Neck: no JVD, carotid bruits, or masses Cardiac: RRR; no murmurs, rubs, or gallops,no edema  Respiratory:  clear to auscultation bilaterally, normal work of breathing GI: soft, nontender, nondistended, + BS MS: no deformity or atrophy  Skin: warm and dry, no rash Neuro:  Strength and sensation are intact Psych: euthymic mood, full affect   EKG:   The ekg ordered today demonstrates NSR, no Signficant ST changes   Recent Labs: No results found for requested labs within last 8760 hours.   Lipid Panel No results found for: CHOL, TRIG, HDL, CHOLHDL, VLDL, LDLCALC, LDLDIRECT   Other studies Reviewed: Additional studies/ records that were reviewed today  with results demonstrating: 2017 cath reviewed.   ASSESSMENT AND PLAN:  1. CAD: She has had persistent chest pain dating back to 2017.  Mild CAD.   It ws thought to be noncardiac back then.  She now has recurrent chest pain when walking up hill.  It is relieved with rest.  Cath result reviewed showing nonobstructive disease.  She could have vasospastic angina, or microvascular disease.  If there is no severe epicardial disease noted by CT, would consider starting Imdur 30 mg daily to help with any potential spasm.  Could also try amlodipine at a low dose.  Other, noncardiac etiologies of chest pain could also be considered.    2. Hyperlipidemia:  LDL 156 at last check in 3/19.   Using fish oil.  Continue regular exercise and healthy diet to try to help lower cholesterol.  She feels that her exercise is limited because of the exertional symptoms she is having. 3. Foot pain: limited walking for some time.     Current medicines are reviewed at length with the patient today.  The patient concerns regarding her medicines were addressed.  The following changes have been made:  No change  Labs/ tests ordered today include:  No orders of the defined types were placed in this encounter.   Recommend 150 minutes/week of aerobic exercise Low fat, low carb, high fiber diet recommended  Disposition:   FU in based on results of CT scan   Signed, Lance Muss, MD  12/17/2017 10:42 AM    [facility] [address] [place], [place], [place]  [address] Phone: [phone]; Fax: [phone]

## 2017-12-17 ENCOUNTER — Ambulatory Visit (INDEPENDENT_AMBULATORY_CARE_PROVIDER_SITE_OTHER): Payer: BLUE CROSS/BLUE SHIELD | Admitting: Interventional Cardiology

## 2017-12-17 ENCOUNTER — Encounter: Payer: Self-pay | Admitting: Interventional Cardiology

## 2017-12-17 VITALS — BP 140/80 | HR 77 | Ht 66.25 in | Wt 164.6 lb

## 2017-12-17 DIAGNOSIS — M79673 Pain in unspecified foot: Secondary | ICD-10-CM

## 2017-12-17 DIAGNOSIS — Z01812 Encounter for preprocedural laboratory examination: Secondary | ICD-10-CM | POA: Diagnosis not present

## 2017-12-17 DIAGNOSIS — E785 Hyperlipidemia, unspecified: Secondary | ICD-10-CM

## 2017-12-17 DIAGNOSIS — R072 Precordial pain: Secondary | ICD-10-CM

## 2017-12-17 DIAGNOSIS — I25119 Atherosclerotic heart disease of native coronary artery with unspecified angina pectoris: Secondary | ICD-10-CM

## 2017-12-17 MED ORDER — METOPROLOL TARTRATE 50 MG PO TABS: ORAL_TABLET | ORAL | 0 refills | Status: DC

## 2017-12-17 NOTE — Patient Instructions (Signed)
Medication Instructions:  Your physician recommends that you continue on your current medications as directed. Please refer to the Current Medication list given to you today.  If you need a refill on your cardiac medications before your next appointment, please call your pharmacy.   Lab work: Your physician recommends that you return for lab work prior to your Cardiac CT for BMET  If you have labs (blood work) drawn today and your tests are completely normal, you will receive your results only by: . MyChart Message (if you have MyChart) OR . A paper copy in the mail If you have any lab test that is abnormal or we need to change your treatment, we will call you to review the rMarland Kitchenesults.  Testing/Procedures: Your physician has requested that you have cardiac CT. Cardiac computed tomography (CT) is a painless test that uses an x-ray machine to take clear, detailed pictures of your heart. For further information please visit [email or web address]. Please follow instruction sheet as given.  Follow-Up: . Based on test results  Any Other Special Instructions Will Be Listed Below (If Applicable).  CARDIAC CT INSTRUCTIONS  Please arrive at the [facility] main entrance of [facility] at xx:xx AM (30-45 minutes prior to test start time)  [facility] [address] [place], [place] [address] [phone]  Proceed to the [facility] Radiology Department (First Floor).  Please follow these instructions carefully (unless otherwise directed):  On the Night Before the Test: . Be sure to Drink plenty of water. . Do not consume any caffeinated/decaffeinated beverages or chocolate 12 hours prior to your test. . Do not take any antihistamines 12 hours prior to your test.  On the Day of the Test: . Drink plenty of water. Do not drink any water within one hour of the test. . Do not eat any food 4 hours prior to the test. . You may take your regular medications prior to the test.   . Take metoprolol (Lopressor) two hours prior to test.      After the Test: . Drink plenty of water. . After receiving IV contrast, you may experience a mild flushed feeling. This is normal. . On occasion, you may experience a mild rash up to 24 hours after the test. This is not dangerous. If this occurs, you can take Benadryl 25 mg and increase your fluid intake. . If you experience trouble breathing, this can be serious. If it is severe call 911 IMMEDIATELY. If it is mild, please call our office.

## 2017-12-25 ENCOUNTER — Telehealth: Payer: Self-pay | Admitting: Interventional Cardiology

## 2017-12-25 NOTE — Telephone Encounter (Signed)
  Patient would like an update on where she is with getting her CT done

## 2017-12-25 NOTE — Telephone Encounter (Signed)
Returned call to patient and made her aware that we are still pending pre-cert for her Cardiac CT. Patient states that her Sx have not worsened but have not improved and that she is eager to get it done. Made patient aware that I will send a message to the pre-cert department. Instructed the patient to let us know if she has a change in her Sx. Patient verbalized understanding and thanked me for the call.

## 2018-01-21 ENCOUNTER — Ambulatory Visit (HOSPITAL_COMMUNITY): Payer: BLUE CROSS/BLUE SHIELD

## 2018-04-18 DIAGNOSIS — Z0000 Encounter for general adult medical examination without abnormal findings: Secondary | ICD-10-CM | POA: Diagnosis not present

## 2018-04-18 DIAGNOSIS — E785 Hyperlipidemia, unspecified: Secondary | ICD-10-CM | POA: Diagnosis not present

## 2018-09-16 DIAGNOSIS — E041 Nontoxic single thyroid nodule: Secondary | ICD-10-CM | POA: Diagnosis not present

## 2018-09-16 DIAGNOSIS — R0989 Other specified symptoms and signs involving the circulatory and respiratory systems: Secondary | ICD-10-CM | POA: Diagnosis not present

## 2018-09-19 ENCOUNTER — Other Ambulatory Visit: Payer: Self-pay | Admitting: Otolaryngology

## 2018-09-19 DIAGNOSIS — E041 Nontoxic single thyroid nodule: Secondary | ICD-10-CM

## 2018-09-20 ENCOUNTER — Ambulatory Visit
Admission: RE | Admit: 2018-09-20 | Discharge: 2018-09-20 | Disposition: A | Discharge status: Home / Self Care | Payer: BC Managed Care – PPO | Source: Ambulatory Visit | Attending: Otolaryngology | Admitting: Otolaryngology

## 2018-09-20 DIAGNOSIS — E041 Nontoxic single thyroid nodule: Secondary | ICD-10-CM | POA: Diagnosis not present

## 2018-09-30 ENCOUNTER — Other Ambulatory Visit: Payer: Self-pay | Admitting: Otolaryngology

## 2018-09-30 DIAGNOSIS — E041 Nontoxic single thyroid nodule: Secondary | ICD-10-CM

## 2018-10-09 ENCOUNTER — Other Ambulatory Visit (HOSPITAL_COMMUNITY)
Admission: RE | Admit: 2018-10-09 | Discharge: 2018-10-09 | Disposition: A | Discharge status: Home / Self Care | Payer: BC Managed Care – PPO | Source: Ambulatory Visit | Attending: Radiology | Admitting: Radiology

## 2018-10-09 ENCOUNTER — Ambulatory Visit
Admission: RE | Admit: 2018-10-09 | Discharge: 2018-10-09 | Disposition: A | Discharge status: Home / Self Care | Payer: BC Managed Care – PPO | Source: Ambulatory Visit | Attending: Otolaryngology | Admitting: Otolaryngology

## 2018-10-09 DIAGNOSIS — E041 Nontoxic single thyroid nodule: Secondary | ICD-10-CM

## 2018-10-11 LAB — CYTOLOGY - NON PAP

## 2018-10-15 DIAGNOSIS — E041 Nontoxic single thyroid nodule: Secondary | ICD-10-CM | POA: Diagnosis not present

## 2018-10-16 DIAGNOSIS — E041 Nontoxic single thyroid nodule: Secondary | ICD-10-CM | POA: Diagnosis not present

## 2018-10-16 DIAGNOSIS — R0989 Other specified symptoms and signs involving the circulatory and respiratory systems: Secondary | ICD-10-CM | POA: Diagnosis not present

## 2018-10-23 ENCOUNTER — Other Ambulatory Visit: Payer: Self-pay | Admitting: Otolaryngology

## 2018-10-23 DIAGNOSIS — E049 Nontoxic goiter, unspecified: Secondary | ICD-10-CM | POA: Diagnosis present

## 2018-10-23 DIAGNOSIS — E041 Nontoxic single thyroid nodule: Secondary | ICD-10-CM | POA: Diagnosis present

## 2018-10-28 ENCOUNTER — Encounter (HOSPITAL_COMMUNITY): Payer: Self-pay

## 2018-10-28 NOTE — Pre-Procedure Instructions (Signed)
[id number] [place], [place] [address] Phone: [phone] Fax: [phone]      Your procedure is scheduled on Friday October 16th.  Report to [facility] Main Entrance "A" at 5:30 A.M., and check in at the Admitting office.  Call this number if you have problems the morning of surgery:  [phone]  Call [phone] if you have any questions prior to your surgery date Monday-Friday 8am-4pm    Remember:  Do not eat or drink after midnight the night before your surgery     Take these medicines the morning of surgery with A SIP OF WATER  metoprolol tartrate (LOPRESSOR)   Follow your surgeon's instructions on when to stop Asprin.  If no instructions were given by your surgeon then you will need to call the office to get those instructions.     7 days prior to surgery STOP taking any Aspirin (unless otherwise instructed by your surgeon), Aleve, Naproxen, Ibuprofen, Motrin, Advil, Goody's, BC's, all herbal medications, fish oil, and all vitamins.    The Morning of Surgery  Do not wear jewelry, make-up or nail polish.  Do not wear lotions, powders, or perfumes/colognes, or deodorant  Do not shave 48 hours prior to surgery.  Men may shave face and neck.  Do not bring valuables to the hospital.  [facility] is not responsible for any belongings or valuables.  If you are a smoker, DO NOT Smoke 24 hours prior to surgery IF you wear a CPAP at night please bring your mask, tubing, and machine the morning of surgery   Remember that you must have someone to transport you home after your surgery, and remain with you for 24 hours if you are discharged the same day.   Contacts, glasses, hearing aids, dentures or bridgework may not be worn into surgery.    Leave your suitcase in the car.  After surgery it may be brought to your room.  For patients admitted to the hospital, discharge time will be determined by your treatment  team.  Patients discharged the day of surgery will not be allowed to drive home.    Special instructions:   [place]- Preparing For Surgery  Before surgery, you can play an important role. Because skin is not sterile, your skin needs to be as free of germs as possible. You can reduce the number of germs on your skin by washing with CHG (chlorahexidine gluconate) Soap before surgery.  CHG is an antiseptic cleaner which kills germs and bonds with the skin to continue killing germs even after washing.    Oral Hygiene is also important to reduce your risk of infection.  Remember - BRUSH YOUR TEETH THE MORNING OF SURGERY WITH YOUR REGULAR TOOTHPASTE  Please do not use if you have an allergy to CHG or antibacterial soaps. If your skin becomes reddened/irritated stop using the CHG.  Do not shave (including legs and underarms) for at least 48 hours prior to first CHG shower. It is OK to shave your face.  Please follow these instructions carefully.   1. Shower the NIGHT BEFORE SURGERY and the MORNING OF SURGERY with CHG Soap.   2. If you chose to wash your hair, wash your hair first as usual with your normal shampoo.  3. After you shampoo, rinse your hair and body thoroughly to remove the shampoo.  4. Use CHG as you would any other liquid soap. You can apply CHG directly to the skin  and wash gently with a scrungie or a clean washcloth.   5. Apply the CHG Soap to your body ONLY FROM THE NECK DOWN.  Do not use on open wounds or open sores. Avoid contact with your eyes, ears, mouth and genitals (private parts). Wash Face and genitals (private parts)  with your normal soap.   6. Wash thoroughly, paying special attention to the area where your surgery will be performed.  7. Thoroughly rinse your body with warm water from the neck down.  8. DO NOT shower/wash with your normal soap after using and rinsing off the CHG Soap.  9. Pat yourself dry with a CLEAN TOWEL.  10. Wear CLEAN PAJAMAS to bed  the night before surgery, wear comfortable clothes the morning of surgery  11. Place CLEAN SHEETS on your bed the night of your first shower and DO NOT SLEEP WITH PETS.    Day of Surgery:  Do not apply any deodorants/lotions. Please shower the morning of surgery with the CHG soap  Please wear clean clothes to the hospital/surgery center.   Remember to brush your teeth WITH YOUR REGULAR TOOTHPASTE.   Please read over the following fact sheets that you were given.

## 2018-10-29 ENCOUNTER — Other Ambulatory Visit: Payer: Self-pay

## 2018-10-29 ENCOUNTER — Encounter (HOSPITAL_COMMUNITY): Payer: Self-pay

## 2018-10-29 ENCOUNTER — Other Ambulatory Visit (HOSPITAL_COMMUNITY)
Admission: RE | Admit: 2018-10-29 | Discharge: 2018-10-29 | Disposition: A | Discharge status: Home / Self Care | Payer: BC Managed Care – PPO | Source: Ambulatory Visit | Attending: Otolaryngology | Admitting: Otolaryngology

## 2018-10-29 ENCOUNTER — Ambulatory Visit (HOSPITAL_COMMUNITY)
Admission: RE | Admit: 2018-10-29 | Discharge: 2018-10-29 | Disposition: A | Discharge status: Home / Self Care | Payer: BC Managed Care – PPO | Source: Ambulatory Visit | Attending: Anesthesiology | Admitting: Anesthesiology

## 2018-10-29 ENCOUNTER — Other Ambulatory Visit: Payer: BC Managed Care – PPO

## 2018-10-29 ENCOUNTER — Encounter (HOSPITAL_COMMUNITY)
Admission: RE | Admit: 2018-10-29 | Discharge: 2018-10-29 | Disposition: A | Discharge status: Home / Self Care | Payer: BC Managed Care – PPO | Source: Ambulatory Visit | Attending: Otolaryngology | Admitting: Otolaryngology

## 2018-10-29 DIAGNOSIS — Z01818 Encounter for other preprocedural examination: Secondary | ICD-10-CM

## 2018-10-29 DIAGNOSIS — Z01812 Encounter for preprocedural laboratory examination: Secondary | ICD-10-CM | POA: Diagnosis not present

## 2018-10-29 DIAGNOSIS — Z20828 Contact with and (suspected) exposure to other viral communicable diseases: Secondary | ICD-10-CM | POA: Insufficient documentation

## 2018-10-29 HISTORY — DX: Cough, unspecified: R05.9

## 2018-10-29 LAB — BASIC METABOLIC PANEL
Anion gap: 11 (ref 5–15)
BUN: 16 mg/dL (ref 6–20)
CO2: 25 mmol/L (ref 22–32)
Calcium: 9.3 mg/dL (ref 8.9–10.3)
Chloride: 104 mmol/L (ref 98–111)
Creatinine, Ser: 0.89 mg/dL (ref 0.44–1.00)
GFR calc Af Amer: >60 mL/min (ref >60)
GFR calc non Af Amer: >60 mL/min (ref >60)
Glucose, Bld: 114 mg/dL — ABNORMAL HIGH (ref 70–99)
Potassium: 3.8 mmol/L (ref 3.5–5.1)
Sodium: 140 mmol/L (ref 135–145)

## 2018-10-29 LAB — CBC
HCT: 42.3 % (ref 36.0–46.0)
Hemoglobin: 13.2 g/dL (ref 12.0–15.0)
MCH: 23.3 pg — ABNORMAL LOW (ref 26.0–34.0)
MCHC: 31.2 g/dL (ref 30.0–36.0)
MCV: 74.7 fL — ABNORMAL LOW (ref 80.0–100.0)
Platelets: 325 10*3/uL (ref 150–400)
RBC: 5.66 MIL/uL — ABNORMAL HIGH (ref 3.87–5.11)
RDW: 15.4 % (ref 11.5–15.5)
WBC: 6.6 10*3/uL (ref 4.0–10.5)
nRBC: 0 % (ref 0.0–0.2)

## 2018-10-29 MED ORDER — CHLORHEXIDINE GLUCONATE CLOTH 2 % EX PADS: 6.0000 | MEDICATED_PAD | Freq: Once | CUTANEOUS | Status: DC

## 2018-10-29 NOTE — Progress Notes (Addendum)
PCP - [place] ?NAME Cardiologist - SAW  DR.SMITH FOR CATH IN 2017    Chest x-ray - TODAY EKG - 11/19 Stress Test - NA       ECHO - NA Cardiac Cath -11/17            : Aspirin Instructions:STOP     COVID TEST- TODAY   Anesthesia review: PAST CATH-CLEAN    SAW DR Irish Lack 12/19 FOR CP Patient denies shortness of breath, fever, cough and chest pain at PAT appointment   Patient verbalized understanding of instructions that were given to them at the PAT appointment. Patient was also instructed that they will need to review over the PAT instructions again at home before surgery.

## 2018-10-30 LAB — NOVEL CORONAVIRUS, NAA (HOSP ORDER, SEND-OUT TO REF LAB; TAT 18-24 HRS): SARS-CoV-2, NAA: NOT DETECTED

## 2018-10-30 NOTE — Anesthesia Preprocedure Evaluation (Addendum)
Anesthesia Evaluation  Patient identified by MRN, date of birth, ID band Patient awake    Reviewed: Allergy & Precautions, NPO status , Patient's Chart, lab work & pertinent test results  History of Anesthesia Complications Negative for: history of anesthetic complications  Airway Mallampati: II  TM Distance: >3 FB Neck ROM: Full    Dental  (+) Teeth Intact   Pulmonary neg pulmonary ROS,    Pulmonary exam normal        Cardiovascular + angina Normal cardiovascular exam  Exertional chest pain - had 2017 cath which showed mild nonobstructive CAD   Neuro/Psych negative neurological ROS  negative psych ROS   GI/Hepatic negative GI ROS, Neg liver ROS,   Endo/Other  negative endocrine ROS  Renal/GU negative Renal ROS  negative genitourinary   Musculoskeletal negative musculoskeletal ROS (+)   Abdominal   Peds  Hematology negative hematology ROS (+)   Anesthesia Other Findings   Reproductive/Obstetrics                           Anesthesia Physical Anesthesia Plan  ASA: II  Anesthesia Plan: General   Post-op Pain Management:    Induction: Intravenous  PONV Risk Score and Plan: 3 and Ondansetron, Dexamethasone, Treatment may vary due to age or medical condition and Midazolam  Airway Management Planned: Oral ETT  Additional Equipment: None  Intra-op Plan:   Post-operative Plan: Extubation in OR  Informed Consent: I have reviewed the patients History and Physical, chart, labs and discussed the procedure including the risks, benefits and alternatives for the proposed anesthesia with the patient or authorized representative who has indicated his/her understanding and acceptance.     Dental advisory given  Plan Discussed with:   Anesthesia Plan Comments: (Pt had cardiac eval in 2017 for CP. She ultimately had a cath showing mild nonobstructive disease and Dr. Tamala Julian felt other  etiologies of her chest discomfort should be sought. Pt was seen again by Dr. Irish Lack 12/19 for continuation of similar symptoms. Cardiac CT was discussed, subsequent notes state waiting for insurance approval, doesn't appear scan was completed.   Preop labs reviewed, WNL.   EKG 11/27/17: NSR. Rate 65.  Cath 12/07/15:  The left ventricular ejection fraction is 55-65% by visual estimate.  LV end diastolic pressure is normal.  Mid LAD to Dist LAD lesion, 25 %stenosed.    Widely patent coronary arteries. Irregularities with up to 25% narrowing is noted in the proximal to mid LAD.  Normal left ventricular systolic function with EF 65%.  RECOMMENDATIONS:   Investigate alternative explanations for exertional chest discomfort. Musculoskeletal etiology, pleural etiology, and pulmonary etiology should be considered. Anxiety may be a diagnoses of exclusion. She was very concerned that she had heart trouble like her parents and brothers.)      Anesthesia Quick Evaluation

## 2018-10-30 NOTE — Progress Notes (Signed)
Anesthesia Chart Review: Pt had cardiac eval in 2017 for CP. She ultimately had a cath showing mild nonobstructive disease and Dr. Tamala Julian felt other etiologies of her chest discomfort should be sought. Pt was seen again by Dr. Irish Lack 12/19 for continuation of similar symptoms. Cardiac CT was discussed, subsequent notes state waiting for insurance approval, doesn't appear scan was completed.   Preop labs reviewed, WNL.   EKG 11/27/17: NSR. Rate 65.  Cath 12/07/15:  The left ventricular ejection fraction is 55-65% by visual estimate.  LV end diastolic pressure is normal.  Mid LAD to Dist LAD lesion, 25 %stenosed.    Widely patent coronary arteries. Irregularities with up to 25% narrowing is noted in the proximal to mid LAD.  Normal left ventricular systolic function with EF 65%.  RECOMMENDATIONS:   Investigate alternative explanations for exertional chest discomfort. Musculoskeletal etiology, pleural etiology, and pulmonary etiology should be considered. Anxiety may be a diagnoses of exclusion. She was very concerned that she had heart trouble like her parents and brothers.   Michele Mcdaniel [facility] Short Stay Center/Anesthesiology Phone [phone] 10/30/2018 11:02 AM

## 2018-11-01 ENCOUNTER — Ambulatory Visit (HOSPITAL_COMMUNITY)
Admission: RE | Admit: 2018-11-01 | Discharge: 2018-11-02 | Disposition: A | Discharge status: Home / Self Care | Payer: BC Managed Care – PPO | Attending: Otolaryngology | Admitting: Otolaryngology

## 2018-11-01 ENCOUNTER — Encounter (HOSPITAL_COMMUNITY): Payer: Self-pay

## 2018-11-01 ENCOUNTER — Other Ambulatory Visit: Payer: Self-pay

## 2018-11-01 ENCOUNTER — Ambulatory Visit (HOSPITAL_COMMUNITY): Payer: BC Managed Care – PPO | Admitting: Physician Assistant

## 2018-11-01 ENCOUNTER — Encounter (HOSPITAL_COMMUNITY)
Admission: RE | Disposition: A | Discharge status: Home / Self Care | Payer: Self-pay | Source: Home / Self Care | Attending: Otolaryngology

## 2018-11-01 ENCOUNTER — Ambulatory Visit (HOSPITAL_COMMUNITY): Payer: BC Managed Care – PPO | Admitting: Anesthesiology

## 2018-11-01 DIAGNOSIS — E049 Nontoxic goiter, unspecified: Secondary | ICD-10-CM | POA: Diagnosis present

## 2018-11-01 DIAGNOSIS — F458 Other somatoform disorders: Secondary | ICD-10-CM | POA: Insufficient documentation

## 2018-11-01 DIAGNOSIS — I2510 Atherosclerotic heart disease of native coronary artery without angina pectoris: Secondary | ICD-10-CM | POA: Insufficient documentation

## 2018-11-01 DIAGNOSIS — I200 Unstable angina: Secondary | ICD-10-CM | POA: Diagnosis not present

## 2018-11-01 DIAGNOSIS — R0989 Other specified symptoms and signs involving the circulatory and respiratory systems: Secondary | ICD-10-CM | POA: Diagnosis not present

## 2018-11-01 DIAGNOSIS — Z79899 Other long term (current) drug therapy: Secondary | ICD-10-CM | POA: Insufficient documentation

## 2018-11-01 DIAGNOSIS — E041 Nontoxic single thyroid nodule: Secondary | ICD-10-CM | POA: Diagnosis not present

## 2018-11-01 DIAGNOSIS — Z7982 Long term (current) use of aspirin: Secondary | ICD-10-CM | POA: Diagnosis not present

## 2018-11-01 DIAGNOSIS — E785 Hyperlipidemia, unspecified: Secondary | ICD-10-CM | POA: Insufficient documentation

## 2018-11-01 DIAGNOSIS — E079 Disorder of thyroid, unspecified: Secondary | ICD-10-CM | POA: Diagnosis not present

## 2018-11-01 HISTORY — PX: THYROIDECTOMY: SHX17

## 2018-11-01 SURGERY — THYROIDECTOMY
Anesthesia: General | Site: Neck | Laterality: Right

## 2018-11-01 MED ORDER — LIDOCAINE-EPINEPHRINE 1 %-1:100000 IJ SOLN
INTRAMUSCULAR | Status: DC | PRN
  Administered 2018-11-01: 10 mL via INTRADERMAL
  Administered 2018-11-01: 5 mL via INTRADERMAL

## 2018-11-01 MED ORDER — DOCUSATE SODIUM 100 MG PO CAPS
100.0000 mg | ORAL_CAPSULE | Freq: Two times a day (BID) | ORAL | Status: DC
  Administered 2018-11-01 – 2018-11-02 (×3): 100 mg via ORAL
  Filled 2018-11-01 (×3): qty 1

## 2018-11-01 MED ORDER — SUCCINYLCHOLINE CHLORIDE 20 MG/ML IJ SOLN
INTRAMUSCULAR | Status: DC | PRN
  Administered 2018-11-01: 80 mg via INTRAVENOUS

## 2018-11-01 MED ORDER — HEMOSTATIC AGENTS (NO CHARGE) OPTIME
TOPICAL | Status: DC | PRN
  Administered 2018-11-01: 1 via TOPICAL

## 2018-11-01 MED ORDER — BACITRACIN-NEOMYCIN-POLYMYXIN 400-5-5000 EX OINT
TOPICAL_OINTMENT | CUTANEOUS | Status: AC
  Filled 2018-11-01: qty 1

## 2018-11-01 MED ORDER — LIDOCAINE 2% (20 MG/ML) 5 ML SYRINGE
INTRAMUSCULAR | Status: DC | PRN
  Administered 2018-11-01: 60 mg via INTRAVENOUS

## 2018-11-01 MED ORDER — FENTANYL CITRATE (PF) 100 MCG/2ML IJ SOLN
25.0000 ug | INTRAMUSCULAR | Status: DC | PRN
  Administered 2018-11-01: 50 ug via INTRAVENOUS

## 2018-11-01 MED ORDER — ONDANSETRON HCL 4 MG/2ML IJ SOLN: 4.0000 mg | Freq: Once | INTRAMUSCULAR | Status: DC | PRN

## 2018-11-01 MED ORDER — ONDANSETRON HCL 4 MG PO TABS: 4.0000 mg | ORAL_TABLET | ORAL | Status: DC | PRN

## 2018-11-01 MED ORDER — DEXTROSE-NACL 5-0.9 % IV SOLN
INTRAVENOUS | Status: DC
  Administered 2018-11-01: 14:00:00 via INTRAVENOUS

## 2018-11-01 MED ORDER — ONDANSETRON HCL 4 MG/2ML IJ SOLN
INTRAMUSCULAR | Status: DC | PRN
  Administered 2018-11-01: 4 mg via INTRAVENOUS

## 2018-11-01 MED ORDER — HYDROCODONE-ACETAMINOPHEN 5-325 MG PO TABS
1.0000 | ORAL_TABLET | ORAL | Status: DC | PRN
  Administered 2018-11-01 – 2018-11-02 (×3): 1 via ORAL
  Filled 2018-11-01 (×4): qty 1

## 2018-11-01 MED ORDER — HYDROCODONE-ACETAMINOPHEN 5-325 MG PO TABS: 1.0000 | ORAL_TABLET | ORAL | 0 refills | Status: DC | PRN

## 2018-11-01 MED ORDER — CEFAZOLIN SODIUM-DEXTROSE 2-4 GM/100ML-% IV SOLN
2.0000 g | INTRAVENOUS | Status: AC
  Administered 2018-11-01: 2 g via INTRAVENOUS

## 2018-11-01 MED ORDER — PHENOL 1.4 % MT LIQD
1.0000 | OROMUCOSAL | Status: DC | PRN
  Administered 2018-11-01: 1 via OROMUCOSAL
  Filled 2018-11-01: qty 177

## 2018-11-01 MED ORDER — PROPOFOL 10 MG/ML IV BOLUS
INTRAVENOUS | Status: DC | PRN
  Administered 2018-11-01: 200 mg via INTRAVENOUS

## 2018-11-01 MED ORDER — MIDAZOLAM HCL 2 MG/2ML IJ SOLN
INTRAMUSCULAR | Status: AC
  Filled 2018-11-01: qty 2

## 2018-11-01 MED ORDER — LIDOCAINE-EPINEPHRINE 1 %-1:100000 IJ SOLN
INTRAMUSCULAR | Status: AC
  Filled 2018-11-01: qty 1

## 2018-11-01 MED ORDER — SODIUM CHLORIDE 0.9 % IV SOLN
INTRAVENOUS | Status: DC | PRN
  Administered 2018-11-01: 10 ug/min via INTRAVENOUS

## 2018-11-01 MED ORDER — LACTATED RINGERS IV SOLN
INTRAVENOUS | Status: DC
  Administered 2018-11-01: 07:00:00 via INTRAVENOUS

## 2018-11-01 MED ORDER — ONDANSETRON HCL 4 MG/2ML IJ SOLN: 4.0000 mg | INTRAMUSCULAR | Status: DC | PRN

## 2018-11-01 MED ORDER — MIDAZOLAM HCL 2 MG/2ML IJ SOLN
INTRAMUSCULAR | Status: DC | PRN
  Administered 2018-11-01: 2 mg via INTRAVENOUS

## 2018-11-01 MED ORDER — OXYCODONE HCL 5 MG/5ML PO SOLN: 5.0000 mg | Freq: Once | ORAL | Status: DC | PRN

## 2018-11-01 MED ORDER — CEFAZOLIN SODIUM-DEXTROSE 2-4 GM/100ML-% IV SOLN
2.0000 g | INTRAVENOUS | Status: DC
  Filled 2018-11-01: qty 100

## 2018-11-01 MED ORDER — OXYCODONE HCL 5 MG PO TABS: 5.0000 mg | ORAL_TABLET | Freq: Once | ORAL | Status: DC | PRN

## 2018-11-01 MED ORDER — FENTANYL CITRATE (PF) 100 MCG/2ML IJ SOLN
INTRAMUSCULAR | Status: AC
  Filled 2018-11-01: qty 2

## 2018-11-01 MED ORDER — ACETAMINOPHEN 160 MG/5ML PO SOLN
650.0000 mg | ORAL | Status: DC | PRN
  Administered 2018-11-02: 650 mg via ORAL
  Filled 2018-11-01: qty 20.3

## 2018-11-01 MED ORDER — FENTANYL CITRATE (PF) 250 MCG/5ML IJ SOLN
INTRAMUSCULAR | Status: AC
  Filled 2018-11-01: qty 5

## 2018-11-01 MED ORDER — ACETAMINOPHEN 650 MG RE SUPP: 650.0000 mg | RECTAL | Status: DC | PRN

## 2018-11-01 MED ORDER — DEXAMETHASONE SODIUM PHOSPHATE 10 MG/ML IJ SOLN
INTRAMUSCULAR | Status: DC | PRN
  Administered 2018-11-01: 10 mg via INTRAVENOUS

## 2018-11-01 MED ORDER — FENTANYL CITRATE (PF) 250 MCG/5ML IJ SOLN
INTRAMUSCULAR | Status: DC | PRN
  Administered 2018-11-01: 100 ug via INTRAVENOUS
  Administered 2018-11-01: 50 ug via INTRAVENOUS
  Administered 2018-11-01: 100 ug via INTRAVENOUS

## 2018-11-01 MED ORDER — PROPOFOL 10 MG/ML IV BOLUS
INTRAVENOUS | Status: AC
  Filled 2018-11-01: qty 20

## 2018-11-01 MED ORDER — 0.9 % SODIUM CHLORIDE (POUR BTL) OPTIME
TOPICAL | Status: DC | PRN
  Administered 2018-11-01: 1000 mL

## 2018-11-01 SURGICAL SUPPLY — 50 items
BLADE SURG 15 STRL LF DISP TIS (BLADE) IMPLANT
BLADE SURG 15 STRL SS (BLADE)
CANISTER SUCT 3000ML PPV (MISCELLANEOUS) ×2 IMPLANT
CLIP LIGATING EXTRA MED SLVR (CLIP) ×2 IMPLANT
CLIP LIGATING EXTRA SM BLUE (MISCELLANEOUS) ×2 IMPLANT
CONT SPEC 4OZ CLIKSEAL STRL BL (MISCELLANEOUS) IMPLANT
CORD BIPOLAR FORCEPS 12FT (ELECTRODE) ×2 IMPLANT
COVER SURGICAL LIGHT HANDLE (MISCELLANEOUS) ×2 IMPLANT
COVER WAND RF STERILE (DRAPES) IMPLANT
DERMABOND ADHESIVE PROPEN (GAUZE/BANDAGES/DRESSINGS) ×1
DERMABOND ADVANCED (GAUZE/BANDAGES/DRESSINGS)
DERMABOND ADVANCED .7 DNX12 (GAUZE/BANDAGES/DRESSINGS) IMPLANT
DERMABOND ADVANCED .7 DNX6 (GAUZE/BANDAGES/DRESSINGS) ×1 IMPLANT
DRAIN JACKSON RD 7FR 3/32 (WOUND CARE) IMPLANT
DRAIN SNY 10 ROU (WOUND CARE) ×2 IMPLANT
DRAPE HALF SHEET 40X57 (DRAPES) IMPLANT
ELECT COATED BLADE 2.86 ST (ELECTRODE) ×2 IMPLANT
ELECT REM PT RETURN 9FT ADLT (ELECTROSURGICAL) ×2
ELECTRODE REM PT RTRN 9FT ADLT (ELECTROSURGICAL) ×1 IMPLANT
EVACUATOR SILICONE 100CC (DRAIN) ×2 IMPLANT
FORCEPS BIPOLAR SPETZLER 8 1.0 (NEUROSURGERY SUPPLIES) ×2 IMPLANT
GAUZE 4X4 16PLY RFD (DISPOSABLE) ×2 IMPLANT
GLOVE BIO SURGEON STRL SZ 6.5 (GLOVE) ×2 IMPLANT
GLOVE BIOGEL M 6.5 STRL (GLOVE) ×2 IMPLANT
GOWN STRL REUS W/ TWL LRG LVL3 (GOWN DISPOSABLE) ×2 IMPLANT
GOWN STRL REUS W/TWL LRG LVL3 (GOWN DISPOSABLE) ×2
KIT BASIN OR (CUSTOM PROCEDURE TRAY) ×2 IMPLANT
KIT TURNOVER KIT B (KITS) ×2 IMPLANT
LOCATOR NERVE 3 VOLT (DISPOSABLE) IMPLANT
NEEDLE HYPO 25GX1X1/2 BEV (NEEDLE) ×2 IMPLANT
NS IRRIG 1000ML POUR BTL (IV SOLUTION) ×2 IMPLANT
PAD ARMBOARD 7.5X6 YLW CONV (MISCELLANEOUS) ×4 IMPLANT
PENCIL BUTTON HOLSTER BLD 10FT (ELECTRODE) ×2 IMPLANT
POSITIONER HEAD DONUT 9IN (MISCELLANEOUS) IMPLANT
POWDER SURGICEL 3.0 GRAM (HEMOSTASIS) IMPLANT
PROBE NERVBE PRASS .33 (MISCELLANEOUS) ×2 IMPLANT
SHEARS HARMONIC 9CM CVD (BLADE) ×2 IMPLANT
SOL PREP POV-IOD 4OZ 10% (MISCELLANEOUS) ×2 IMPLANT
SPONGE INTESTINAL PEANUT (DISPOSABLE) IMPLANT
STAPLER VISISTAT 35W (STAPLE) IMPLANT
SUT ETHILON 2 0 FS 18 (SUTURE) ×2 IMPLANT
SUT PROLENE 3 0 PS 2 (SUTURE) ×2 IMPLANT
SUT PROLENE 5 0 PS 2 (SUTURE) IMPLANT
SUT SILK 3 0 REEL (SUTURE) ×2 IMPLANT
SUT VIC AB 3-0 SH 27 (SUTURE) ×1
SUT VIC AB 3-0 SH 27X BRD (SUTURE) ×1 IMPLANT
SUT VICRYL 4-0 PS2 18IN ABS (SUTURE) ×2 IMPLANT
TOWEL GREEN STERILE FF (TOWEL DISPOSABLE) ×2 IMPLANT
TRAY ENT MC OR (CUSTOM PROCEDURE TRAY) ×2 IMPLANT
TUBE FEEDING 10FR FLEXIFLO (MISCELLANEOUS) IMPLANT

## 2018-11-01 NOTE — Progress Notes (Signed)
Received Pt from [facility] to 6N-14, AOx4, VSS, no complaints of pain at this time. Oriented to room, call bell, phone, and use of bed controls. Will continue to monitor.

## 2018-11-01 NOTE — Plan of Care (Signed)

## 2018-11-01 NOTE — Discharge Instructions (Signed)
°-  You may shower like you normally would 24 hours after surgery. Do not scrub over incision. Pat dry.  Do not pick at any skin glue.  -No strenuous exercise or heavy lifting until 14 days after surgery. Walk/ambulate at home often. Perform calf and stretch exercises often to prevent blood clot formation.  -Do not take more pain medication than prescribed. You should begin slowly weaning off the pain medication. No refills will be prescribed.

## 2018-11-01 NOTE — H&P (Signed)
The surgical history remains accurate and without interval change. The condition still exists which makes the procedure necessary. The patient and/or family is aware of their condition and has been informed of the risks and benefits of surgery, as well as alternatives. All parties have elected to proceed with surgery.   Surgical plan: Right thyroid lobectomy, possible total thyroidectomy  Michele Mcdaniel is an 58 y.o. female.    HPI: This patient presents the morning of surgery for a right hemithyroidectomy, possible total thyroidectomy.  She has noticed an enlarging goiter on the right side of her thyroid gland for many months.  Ultrasound-guided biopsy has shown a suspicious nodule on the side.  She has no family history of thyroid cancer.  She has not had any radiation exposure.  She has felt a little bit of compression symptoms from this.  She has not lost any weight or struggle to eat or breathe while lying flat.  Past Medical History:  Diagnosis Date  . Cough   . HLD (hyperlipidemia)     Past Surgical History:  Procedure Laterality Date  . CARDIAC CATHETERIZATION N/A 12/07/2015   Procedure: Left Heart Cath and Coronary Angiography;  Surgeon: Lyn Records, MD;  Location: [facility] INVASIVE CV LAB;  Service: Cardiovascular;  Laterality: N/A;  . DILATION AND CURETTAGE OF UTERUS    . DILATION AND CURETTAGE, DIAGNOSTIC / THERAPEUTIC      Family History  Problem Relation Age of Onset  . Heart failure Mother   . Peripheral Artery Disease Mother   . Heart attack Father   . Hypertension Brother    Social History:  reports that she has never smoked. She has never used smokeless tobacco. She reports that she does not drink alcohol or use drugs.  Allergies: No Known Allergies  Medications Prior to Admission  Medication Sig Dispense Refill  . aspirin EC 81 MG tablet Take 1 tablet (81 mg total) by mouth daily. 90 tablet 3  . Omega-3 Fatty Acids (FISH OIL) 1000 MG CAPS Take 2,000 mg by  mouth daily.     . metoprolol tartrate (LOPRESSOR) 50 MG tablet Take 1 tablet 2 hours prior to Cardiac CT (Patient not taking: Reported on 10/24/2018) 1 tablet 0    No results found for this or any previous visit (from the past 48 hour(s)). No results found.  Review of Systems  All other systems reviewed and are negative.   Blood pressure (!) 143/79, pulse 80, temperature 98.5 F (36.9 C), resp. rate 20, height 5' 6.25" (1.683 m), weight 76.2 kg, SpO2 96 %. Physical Exam  Constitutional: She is oriented to person, place, and time. She appears well-developed and well-nourished.  HENT:  Head: Normocephalic and atraumatic.  Right Ear: External ear normal.  Left Ear: External ear normal.  Eyes: Pupils are equal, round, and reactive to light. Conjunctivae and EOM are normal.  Neck: Neck supple. No tracheal deviation present. Thyromegaly present.  Cardiovascular: Normal rate, regular rhythm and normal heart sounds.  Respiratory: Effort normal and breath sounds normal. No stridor. No respiratory distress.  GI: Soft. Bowel sounds are normal.  Musculoskeletal: Normal range of motion.  Neurological: She is alert and oriented to person, place, and time.  Skin: Skin is warm and dry.  Psychiatric: She has a normal mood and affect. Her behavior is normal. Judgment and thought content normal.     Assessment/Plan 58 year old female with right thyroid nodule.  We will plan for right hemithyroidectomy, we will send the right thyroid lobe for  frozen and potentially perform completion thyroidectomy today in the operating room depending on frozen pathology.  We discussed the risks and surgery including pain, infection, bleeding, need for further surgery, scar, damage to surrounding structures, recurrent laryngeal nerve injury on one or both sides and potential airway complications, voice changes, potential iatrogenic parathyroidectomy with resulting hypoparathyroidism and hypocalcemia, potential need  for further surgeries.  We will plan on keeping her at least 1 night in the hospital.  Helayne Seminole, MD   Helayne Seminole, MD 11/01/2018, 7:37 AM

## 2018-11-01 NOTE — Anesthesia Procedure Notes (Signed)
Procedure Name: Intubation Date/Time: 11/01/2018 9:13 AM Performed by: Larene Beach, CRNA Pre-anesthesia Checklist: Patient identified, Emergency Drugs available, Suction available and Patient being monitored Patient Re-evaluated:Patient Re-evaluated prior to induction Oxygen Delivery Method: Circle system utilized Preoxygenation: Pre-oxygenation with 100% oxygen Induction Type: IV induction Ventilation: Mask ventilation without difficulty Laryngoscope Size: Glidescope and 3 Grade View: Grade I Tube type: Oral Tube size: 7.0 mm Number of attempts: 1 Airway Equipment and Method: Stylet Placement Confirmation: ETT inserted through vocal cords under direct vision,  positive ETCO2 and breath sounds checked- equal and bilateral Tube secured with: Tape Dental Injury: Teeth and Oropharynx as per pre-operative assessment  Comments: Nims tube placed with Glidescope, Blue marker placed between cords.

## 2018-11-01 NOTE — Op Note (Signed)
DATE OF PROCEDURE: 11/01/2018   PRE-OPERATIVE DIAGNOSIS: E04.1 Thyroid nodule, R09.89 Globus sensation   POST-OPERATIVE DIAGNOSIS: Same   PROCEDURE(S): Right thyroidectomy   SURGEON:  Gavin Pound, MD    ASSISTANT(S): Jodi Marble, MD, who was necessary in order to aid in surgical decision making, provide retraction and aid in hemostasis.   ANESTHESIA: General endotracheal anesthesia with Nim tube    ESTIMATED BLOOD LOSS: 30 mL  SPECIMENS: Right thyroid lobectomy   COMPLICATIONS: None    OPERATIVE FINDINGS: The patient had a very large well-circumscribed right thyroid nodule.  This was firm and very sticky and adherent to the trachea.  The recurrent laryngeal nerve was deep and posterior to the plane of dissection and not encountered.  Candidate for the right superior parathyroid gland was identified and preserved.  Frozen specimen demonstrated follicular lesion but no obvious papillary features.  The left lobe of the thyroid remains and was marked with a 3-0 Prolene suture.   OPERATIVE DETAILS:   She was brought to the operating room and correctly identified.  General endotracheal anesthesia was induced and she was intubated.  The tube was secured. A shoulder roll was placed. The patient was prepped and draped in sterile fashion. The NIM monitor was placed and verified to be working.  A horizontal midline neck incision was made in the lower neck between the two sternocleidomastoid muscles. The incision was carried down through subcutaneous tissues and platysma.  Subplatysmal flaps were elevated superiorly to the level of thyroid notch and inferiorly to sternal notch. Next, the midline raphe of the strap muscles was identified and divided. The underlying thyroid isthmus was seen.   The strap muscles were elevated laterally off the right lobe using blunt finger dissection.  The gland was retracted medially and dissection then proceeded superiorly towards the  superior pedicle. This was carefully dissected circumferentially and then ligated and divided using the Harmonic focus device.  Dissection was then carried inferiorly, where the middle thyroid vein was noted.  This was divided and the gland was retracted medially.  We then carefully dissected near the inferior pole, where we were not able identify the recurrent laryngeal nerve in its normal position, not visually but were able to with the nerve stimulator. Candidates for the rightsuperior parathyroid glands were seen and dissected free and left in the neck.  The inferior thyroid vascular pedicle was then ligated and divided.  Dissection along the distal course of the recurrent laryngeal nerve was performed using a McCabe dissector, peeling the gland off its surface. Next, the thyroid gland was released from its tracheal attachments by dividing Berry's ligament.   Copious irrigation of the neck was performed and hemostasis was achieved with bipolar where necessary. A #10 round drain was brought through the skin and placed into the thyroid compartment. Interrupted 3-0 Vicryls were used to reapproximate the strap muscles in midline and to close platysma.  The dermis was closed with 4-0 Vicryls and Dermabond was used for the skin in a running horizontal mattress fashion.   The patient was awoken from anesthesia, extubated, and transported to PACU in good condition without complications.   Helayne Seminole, MD

## 2018-11-01 NOTE — Anesthesia Postprocedure Evaluation (Signed)
Anesthesia Post Note  Patient: Michele Mcdaniel  Procedure(s) Performed: RIGHT THYROIDECTOMY (Right Neck)     Patient location during evaluation: PACU Anesthesia Type: General Level of consciousness: awake and alert Pain management: pain level controlled Vital Signs Assessment: post-procedure vital signs reviewed and stable Respiratory status: spontaneous breathing, nonlabored ventilation and respiratory function stable Cardiovascular status: blood pressure returned to baseline and stable Postop Assessment: no apparent nausea or vomiting Anesthetic complications: no    Last Vitals:  Vitals:   11/01/18 1214 11/01/18 1215  BP:  137/82  Pulse:  71  Resp:  (!) 8  Temp:    SpO2: 99% 99%    Last Pain:  Vitals:   11/01/18 1214  PainSc: 0-No pain                 Lidia Collum

## 2018-11-01 NOTE — Transfer of Care (Signed)
Immediate Anesthesia Transfer of Care Note  Patient: Michele Mcdaniel  Procedure(s) Performed: RIGHT THYROIDECTOMY (Right Neck)  Patient Location: PACU  Anesthesia Type:General  Level of Consciousness: drowsy and patient cooperative  Airway & Oxygen Therapy: Patient Spontanous Breathing and Patient connected to nasal cannula oxygen  Post-op Assessment: Report given to RN, Post -op Vital signs reviewed and stable and Patient moving all extremities X 4  Post vital signs: Reviewed and stable  Last Vitals:  Vitals Value Taken Time  BP 135/86 11/01/18 1145  Temp 36.1 C 11/01/18 1144  Pulse 90 11/01/18 1145  Resp 11 11/01/18 1145  SpO2 98 % 11/01/18 1145  Vitals shown include unvalidated device data.  Last Pain: There were no vitals filed for this visit.       Complications: No apparent anesthesia complications

## 2018-11-02 ENCOUNTER — Encounter (HOSPITAL_COMMUNITY): Payer: Self-pay | Admitting: Otolaryngology

## 2018-11-02 DIAGNOSIS — F458 Other somatoform disorders: Secondary | ICD-10-CM | POA: Diagnosis not present

## 2018-11-02 DIAGNOSIS — Z7982 Long term (current) use of aspirin: Secondary | ICD-10-CM | POA: Diagnosis not present

## 2018-11-02 DIAGNOSIS — E785 Hyperlipidemia, unspecified: Secondary | ICD-10-CM | POA: Diagnosis not present

## 2018-11-02 DIAGNOSIS — Z79899 Other long term (current) drug therapy: Secondary | ICD-10-CM | POA: Diagnosis not present

## 2018-11-02 DIAGNOSIS — E041 Nontoxic single thyroid nodule: Secondary | ICD-10-CM | POA: Diagnosis not present

## 2018-11-02 DIAGNOSIS — I2510 Atherosclerotic heart disease of native coronary artery without angina pectoris: Secondary | ICD-10-CM | POA: Diagnosis not present

## 2018-11-02 MED ORDER — ACETAMINOPHEN 160 MG/5ML PO SOLN: 650.0000 mg | ORAL | 0 refills | Status: DC | PRN

## 2018-11-02 NOTE — Discharge Summary (Signed)
Physician Discharge Summary  Patient ID: Michele Mcdaniel MRN: [id number] DOB/AGE: 08-06-1960 58 y.o.  Admit date: 11/01/2018 Discharge date: 11/02/2018  Admission Diagnoses: Thyroid nodule  Discharge Diagnoses: Thyroid nodule Principal Problem:   Right thyroid nodule Active Problems:   Thyroid goiter   Thyroid nodule   Discharged Condition: good  Hospital Course: Patient admitted for observation for post thyroid lobectomy.  Doing well.  Eating well.  Voice normal.  No breathing issues.  No pain of any significance.  She is ready to be discharged.  Consults: None  Significant Diagnostic Studies: None  Treatments: surgery: Thyroid lobectomy  Discharge Exam: Blood pressure 138/72, pulse 70, temperature 97.7 F (36.5 C), temperature source Oral, resp. rate 18, height 5\' 6"  (1.676 m), weight 76.2 kg, SpO2 100 %. Awake and alert.  No stridor.  Voice normal.  No twitching or muscle cramps.  Heart is regular.  Lungs are normal effort.  No tenderness or swelling of the extremities.  Disposition:    Patient is discharged to home.  Normal diet.  Follow-up as indicated for the office on November 2.  Call if there is any issues with swelling, redness, or increasing pain.  Also any breathing difficulty. Follow-up Information    Helayne Seminole, MD. Schedule an appointment as soon as possible for a visit in 1 week.   Specialty: Otolaryngology Why: For wound re-check Contact information: [address] [place] [address] [phone]           Signed: Melissa Montane 11/02/2018, 10:34 AM

## 2018-11-06 LAB — SURGICAL PATHOLOGY

## 2018-11-28 ENCOUNTER — Other Ambulatory Visit (HOSPITAL_COMMUNITY): Payer: Self-pay | Admitting: Gastroenterology

## 2018-11-28 ENCOUNTER — Other Ambulatory Visit: Payer: Self-pay | Admitting: Gastroenterology

## 2018-11-28 DIAGNOSIS — R1310 Dysphagia, unspecified: Secondary | ICD-10-CM

## 2018-11-28 DIAGNOSIS — R1013 Epigastric pain: Secondary | ICD-10-CM | POA: Diagnosis not present

## 2018-11-28 DIAGNOSIS — Z800 Family history of malignant neoplasm of digestive organs: Secondary | ICD-10-CM | POA: Diagnosis not present

## 2018-12-06 ENCOUNTER — Ambulatory Visit
Admission: RE | Admit: 2018-12-06 | Discharge: 2018-12-06 | Disposition: A | Discharge status: Home / Self Care | Payer: BC Managed Care – PPO | Source: Ambulatory Visit | Attending: Gastroenterology | Admitting: Gastroenterology

## 2018-12-06 DIAGNOSIS — R1310 Dysphagia, unspecified: Secondary | ICD-10-CM | POA: Diagnosis not present

## 2019-01-13 DIAGNOSIS — K228 Other specified diseases of esophagus: Secondary | ICD-10-CM | POA: Diagnosis not present

## 2019-01-13 DIAGNOSIS — E041 Nontoxic single thyroid nodule: Secondary | ICD-10-CM | POA: Diagnosis not present

## 2019-01-13 DIAGNOSIS — H938X3 Other specified disorders of ear, bilateral: Secondary | ICD-10-CM | POA: Diagnosis not present

## 2019-01-30 DIAGNOSIS — K219 Gastro-esophageal reflux disease without esophagitis: Secondary | ICD-10-CM | POA: Diagnosis not present

## 2019-01-30 DIAGNOSIS — F458 Other somatoform disorders: Secondary | ICD-10-CM | POA: Diagnosis not present

## 2019-01-30 DIAGNOSIS — R933 Abnormal findings on diagnostic imaging of other parts of digestive tract: Secondary | ICD-10-CM | POA: Diagnosis not present

## 2019-01-30 DIAGNOSIS — R682 Dry mouth, unspecified: Secondary | ICD-10-CM | POA: Diagnosis not present

## 2019-02-10 DIAGNOSIS — R1310 Dysphagia, unspecified: Secondary | ICD-10-CM | POA: Diagnosis not present

## 2019-02-10 DIAGNOSIS — R933 Abnormal findings on diagnostic imaging of other parts of digestive tract: Secondary | ICD-10-CM | POA: Diagnosis not present

## 2019-02-10 DIAGNOSIS — R634 Abnormal weight loss: Secondary | ICD-10-CM | POA: Diagnosis not present

## 2019-02-10 DIAGNOSIS — K219 Gastro-esophageal reflux disease without esophagitis: Secondary | ICD-10-CM | POA: Diagnosis not present

## 2019-02-10 DIAGNOSIS — K3189 Other diseases of stomach and duodenum: Secondary | ICD-10-CM | POA: Diagnosis not present

## 2019-02-26 ENCOUNTER — Other Ambulatory Visit: Payer: Self-pay | Admitting: Gastroenterology

## 2019-03-01 ENCOUNTER — Other Ambulatory Visit: Payer: Self-pay

## 2019-03-01 ENCOUNTER — Other Ambulatory Visit (HOSPITAL_COMMUNITY)
Admission: RE | Admit: 2019-03-01 | Discharge: 2019-03-01 | Disposition: A | Discharge status: Home / Self Care | Payer: BC Managed Care – PPO | Source: Ambulatory Visit | Attending: Gastroenterology | Admitting: Gastroenterology

## 2019-03-01 DIAGNOSIS — Z01812 Encounter for preprocedural laboratory examination: Secondary | ICD-10-CM | POA: Diagnosis not present

## 2019-03-01 DIAGNOSIS — Z20822 Contact with and (suspected) exposure to covid-19: Secondary | ICD-10-CM | POA: Insufficient documentation

## 2019-03-01 LAB — SARS CORONAVIRUS 2 (TAT 6-24 HRS): SARS Coronavirus 2: NEGATIVE

## 2019-03-05 ENCOUNTER — Ambulatory Visit (HOSPITAL_COMMUNITY)
Admission: RE | Admit: 2019-03-05 | Discharge: 2019-03-05 | Disposition: A | Discharge status: Home / Self Care | Payer: BC Managed Care – PPO | Attending: Gastroenterology | Admitting: Gastroenterology

## 2019-03-05 ENCOUNTER — Encounter (HOSPITAL_COMMUNITY)
Admission: RE | Disposition: A | Discharge status: Home / Self Care | Payer: Self-pay | Source: Home / Self Care | Attending: Gastroenterology

## 2019-03-05 DIAGNOSIS — R1310 Dysphagia, unspecified: Secondary | ICD-10-CM | POA: Insufficient documentation

## 2019-03-05 HISTORY — PX: ESOPHAGEAL MANOMETRY: SHX5429

## 2019-03-05 HISTORY — PX: PH IMPEDANCE STUDY: SHX5565

## 2019-03-05 SURGERY — IMPEDANCE PH STUDY, ESOPHAGUS

## 2019-03-05 MED ORDER — LIDOCAINE VISCOUS HCL 2 % MT SOLN
OROMUCOSAL | Status: AC
  Filled 2019-03-05: qty 15

## 2019-03-05 SURGICAL SUPPLY — 2 items
FACESHIELD LNG OPTICON STERILE (SAFETY) IMPLANT
GLOVE BIO SURGEON STRL SZ8 (GLOVE) ×4 IMPLANT

## 2019-03-05 NOTE — Progress Notes (Signed)
Esophageal Manometry done per protocol . Pt tolerated well without distress or complication. Ph probe placed per protocol. Pt tolerated well without distress or complication.  Pt educated regarding study and monitor. Pt voiced understanding. Pt will return tomorrow to have probe removed and monitor downloaded.

## 2019-03-07 ENCOUNTER — Encounter: Payer: Self-pay | Admitting: *Deleted

## 2019-03-11 DIAGNOSIS — K219 Gastro-esophageal reflux disease without esophagitis: Secondary | ICD-10-CM | POA: Diagnosis not present

## 2019-03-11 DIAGNOSIS — R1310 Dysphagia, unspecified: Secondary | ICD-10-CM | POA: Diagnosis not present

## 2019-03-11 DIAGNOSIS — R933 Abnormal findings on diagnostic imaging of other parts of digestive tract: Secondary | ICD-10-CM | POA: Diagnosis not present

## 2019-03-11 DIAGNOSIS — R1013 Epigastric pain: Secondary | ICD-10-CM | POA: Diagnosis not present

## 2019-03-20 DIAGNOSIS — R070 Pain in throat: Secondary | ICD-10-CM | POA: Diagnosis not present

## 2019-03-20 DIAGNOSIS — R0989 Other specified symptoms and signs involving the circulatory and respiratory systems: Secondary | ICD-10-CM | POA: Diagnosis not present

## 2019-04-24 DIAGNOSIS — E559 Vitamin D deficiency, unspecified: Secondary | ICD-10-CM | POA: Diagnosis not present

## 2019-04-24 DIAGNOSIS — Z0000 Encounter for general adult medical examination without abnormal findings: Secondary | ICD-10-CM | POA: Diagnosis not present

## 2019-04-24 DIAGNOSIS — R070 Pain in throat: Secondary | ICD-10-CM | POA: Diagnosis not present

## 2019-04-24 DIAGNOSIS — R5383 Other fatigue: Secondary | ICD-10-CM | POA: Diagnosis not present

## 2019-04-24 DIAGNOSIS — Z13220 Encounter for screening for lipoid disorders: Secondary | ICD-10-CM | POA: Diagnosis not present

## 2019-04-24 DIAGNOSIS — E785 Hyperlipidemia, unspecified: Secondary | ICD-10-CM | POA: Diagnosis not present

## 2019-04-24 DIAGNOSIS — R718 Other abnormality of red blood cells: Secondary | ICD-10-CM | POA: Diagnosis not present

## 2019-04-27 DIAGNOSIS — M545 Low back pain: Secondary | ICD-10-CM | POA: Diagnosis not present

## 2019-05-22 ENCOUNTER — Other Ambulatory Visit: Payer: Self-pay | Admitting: Family Medicine

## 2019-07-31 DIAGNOSIS — E785 Hyperlipidemia, unspecified: Secondary | ICD-10-CM | POA: Diagnosis not present

## 2019-07-31 DIAGNOSIS — R0989 Other specified symptoms and signs involving the circulatory and respiratory systems: Secondary | ICD-10-CM | POA: Diagnosis not present

## 2019-07-31 DIAGNOSIS — R7303 Prediabetes: Secondary | ICD-10-CM | POA: Diagnosis not present

## 2019-07-31 DIAGNOSIS — R079 Chest pain, unspecified: Secondary | ICD-10-CM | POA: Diagnosis not present

## 2019-08-27 ENCOUNTER — Encounter (INDEPENDENT_AMBULATORY_CARE_PROVIDER_SITE_OTHER): Payer: Self-pay | Admitting: Otolaryngology

## 2019-08-27 ENCOUNTER — Ambulatory Visit (INDEPENDENT_AMBULATORY_CARE_PROVIDER_SITE_OTHER): Payer: BC Managed Care – PPO | Admitting: Otolaryngology

## 2019-08-27 ENCOUNTER — Other Ambulatory Visit: Payer: Self-pay

## 2019-08-27 VITALS — Temp 97.5°F

## 2019-08-27 DIAGNOSIS — K219 Gastro-esophageal reflux disease without esophagitis: Secondary | ICD-10-CM

## 2019-08-27 NOTE — Progress Notes (Signed)
HPI: Michele Mcdaniel is a 59 y.o. female who presents is referred by his PCP for evaluation of chronic throat discomfort which is always on the right side.  This has been going on for close to a year.  She has had upper endoscopy with GI as well as a barium swallow which was normal.  She had a right thyroid lobectomy performed in October of last year.  She feels like the pain in the throat which is always on the right side is causing the right ear to hurt.  She also complains of drainage in the throat and mucus buildup in the throat.  She does not have trouble swallowing food or eating..  Past Medical History:  Diagnosis Date  . Cough   . HLD (hyperlipidemia)    Past Surgical History:  Procedure Laterality Date  . CARDIAC CATHETERIZATION N/A 12/07/2015   Procedure: Left Heart Cath and Coronary Angiography;  Surgeon: Lyn Records, MD;  Location: [facility] INVASIVE CV LAB;  Service: Cardiovascular;  Laterality: N/A;  . DILATION AND CURETTAGE OF UTERUS    . DILATION AND CURETTAGE, DIAGNOSTIC / THERAPEUTIC    . ESOPHAGEAL MANOMETRY N/A 03/05/2019   Procedure: ESOPHAGEAL MANOMETRY (EM);  Surgeon: Charna Elizabeth, MD;  Location: [facility];  Service: Endoscopy;  Laterality: N/A;  . PH IMPEDANCE STUDY N/A 03/05/2019   Procedure: PH IMPEDANCE STUDY;  Surgeon: Charna Elizabeth, MD;  Location: [facility];  Service: Endoscopy;  Laterality: N/A;  . THYROIDECTOMY Right 11/01/2018   Procedure: RIGHT THYROIDECTOMY;  Surgeon: Graylin Shiver, MD;  Location: [facility] OR;  Service: ENT;  Laterality: Right;   Social History   Socioeconomic History  . Marital status: Married    Spouse name: Not on file  . Number of children: Not on file  . Years of education: Not on file  . Highest education level: Not on file  Occupational History  . Not on file  Tobacco Use  . Smoking status: Never Smoker  . Smokeless tobacco: Never Used  Substance and Sexual Activity  . Alcohol use: No  . Drug use: No  . Sexual  activity: Not on file  Other Topics Concern  . Not on file  Social History Narrative  . Not on file   Social Determinants of Health   Financial Resource Strain:   . Difficulty of Paying Living Expenses:   Food Insecurity:   . Worried About Programme researcher, broadcasting/film/video in the Last Year:   . Barista in the Last Year:   Transportation Needs:   . Freight forwarder (Medical):   Marland Kitchen Lack of Transportation (Non-Medical):   Physical Activity:   . Days of Exercise per Week:   . Minutes of Exercise per Session:   Stress:   . Feeling of Stress :   Social Connections:   . Frequency of Communication with Friends and Family:   . Frequency of Social Gatherings with Friends and Family:   . Attends Religious Services:   . Active Member of Clubs or Organizations:   . Attends Banker Meetings:   Marland Kitchen Marital Status:    Family History  Problem Relation Age of Onset  . Heart failure Mother   . Peripheral Artery Disease Mother   . Heart attack Father   . Hypertension Brother    No Known Allergies Prior to Admission medications   Medication Sig Start Date End Date Taking? Authorizing Provider  acetaminophen (TYLENOL) 160 MG/5ML solution Take 20.3 mLs (650 mg total) by  mouth every 4 (four) hours as needed for mild pain (or temp over 101 degrees F). 11/02/18  Yes Suzanna Obey, MD  HYDROcodone-acetaminophen (NORCO/VICODIN) 5-325 MG tablet Take 1 tablet by mouth every 4 (four) hours as needed for moderate pain. 11/01/18  Yes Graylin Shiver, MD  metoprolol tartrate (LOPRESSOR) 50 MG tablet Take 1 tablet 2 hours prior to Cardiac CT 12/17/17  Yes Corky Crafts, MD  Omega-3 Fatty Acids (FISH OIL) 1000 MG CAPS Take 2,000 mg by mouth daily.    Yes [provider]     Positive ROS: Otherwise negative  All other systems have been reviewed and were otherwise negative with the exception of those mentioned in the HPI and as above.  Physical Exam: Constitutional: Alert,  well-appearing, no acute distress Ears: External ears without lesions or tenderness.  Left ear canal is clear with a clear left TM.  She has moderate wax buildup in the right ear canal that was cleaned with forceps.  The right TM was clear with good mobility on pneumatic otoscopy. Nasal: External nose without lesions. Septum midline with mild rhinitis.  Both millimeters regions were clear with no signs of infection..  Oral: Lips and gums without lesions. Tongue and palate mucosa without lesions. Posterior oropharynx clear.  Patient with small benign appearing tonsils bilaterally.  Palpation of the tonsil regions was soft with no palpable masses. Fiberoptic laryngoscopy was performed to the right nostril.  The nasopharynx was clear.  The base of tongue vallecula epiglottis were normal.  Vocal cords were clear bilaterally.  Piriform sinuses were clear.  She had mild edema of the arytenoid mucosa with very mild erythema on the right side compared to left.  I was able to pass the fiberoptic laryngoscope through the upper esophageal sphincter without difficulty and the upper cervical esophagus was clear. Neck: No palpable adenopathy or masses.  No palpable adenopathy on either side of the neck.  She has a well-healed right lower thyroid scar. Respiratory: Breathing comfortably  Skin: No facial/neck lesions or rash noted.  Laryngoscopy  Date/Time: 08/27/2019 10:49 AM Performed by: Drema Halon, MD Authorized by: Drema Halon, MD   Consent:    Consent obtained:  Verbal   Consent given by:  Patient Procedure details:    Indications: direct visualization of the upper aerodigestive tract     Medication:  Afrin   Instrument: flexible fiberoptic laryngoscope     Scope location: right nare   Sinus:    Right middle meatus: normal     Right nasopharynx: normal   Mouth:    Oropharynx: normal     Vallecula: normal     Base of tongue: normal     Epiglottis: normal   Throat:    True  vocal cords: normal   Comments:     Patient with mild erythema of the arytenoid mucosa on the right side consistent with 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  Clear hypopharynx and larynx otherwise with no evidence of neoplasm or infection.    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  Reviewed this with her.  She has a tendency to sleep on her right side I suspect that is why the symptoms are more right-sided. Clear upper airway examination otherwise.  Plan: Prescribed omeprazole 40 mg daily before dinner for the next month. She will follow-up in 5 to 6 weeks for recheck   Narda Bonds, MD   CC:

## 2019-10-08 ENCOUNTER — Ambulatory Visit (INDEPENDENT_AMBULATORY_CARE_PROVIDER_SITE_OTHER): Payer: BC Managed Care – PPO | Admitting: Otolaryngology

## 2019-10-15 ENCOUNTER — Ambulatory Visit (INDEPENDENT_AMBULATORY_CARE_PROVIDER_SITE_OTHER): Payer: BC Managed Care – PPO | Admitting: Otolaryngology

## 2019-11-09 NOTE — Progress Notes (Signed)
Cardiology Office Note   Date:  11/10/2019   ID:  Michele Mcdaniel, DOB [birth year], MRN [id number]  PCP:  [facility] [place]    No chief complaint on file.  CAD  Wt Readings from Last 3 Encounters:  11/10/19 166 lb 6.4 oz (75.5 kg)  11/01/18 167 lb 15.9 oz (76.2 kg)  10/29/18 167 lb 14.4 oz (76.2 kg)       History of Present Illness: Michele Mcdaniel is a 59 y.o. female    with a history of HLD andmild non obst CAD who presents to clinic for follow up.    She was seen as a new patient on 11/30/15 for evaluation of exertional chest pain. Her symptoms were concerning for angina. We started her on ASA, statin and imdur. She was set up with coronary angiography on 12/07/15 that showed normal coronary arteries with 25% prox-mid LAD and normal LV function. Dr. Katrinka Blazing felt like other causes of chest pain should be ruled out with anxiety being a diagnosis of exclusion.   She continue to have chest pain after the cath.  She stopped imdur due to headaches. Referral to GIor PPI trail was considered but she wanted to continue to monitor at that time.  She has a lot stress with her autistic son and brother in law who has spina bifida.  Since the last visit, she has had some chest pain with walking up a hill.  It can affect her right arm.  She repeatedly states that "this is ridiculous," when speaking of her sx.    Since the last visit, she did not have her CT scan.  She has continued to have chest pain (mid-right side of her chest), worse if she walks up an incline.  No change in the sx.        Past Medical History:  Diagnosis Date  . Cough   . HLD (hyperlipidemia)     Past Surgical History:  Procedure Laterality Date  . CARDIAC CATHETERIZATION N/A 12/07/2015   Procedure: Left Heart Cath and Coronary Angiography;  Surgeon: Lyn Records, MD;  Location: [facility] INVASIVE CV LAB;  Service: Cardiovascular;  Laterality: N/A;  . DILATION AND  CURETTAGE OF UTERUS    . DILATION AND CURETTAGE, DIAGNOSTIC / THERAPEUTIC    . ESOPHAGEAL MANOMETRY N/A 03/05/2019   Procedure: ESOPHAGEAL MANOMETRY (EM);  Surgeon: Charna Elizabeth, MD;  Location: [facility];  Service: Endoscopy;  Laterality: N/A;  . PH IMPEDANCE STUDY N/A 03/05/2019   Procedure: PH IMPEDANCE STUDY;  Surgeon: Charna Elizabeth, MD;  Location: [facility];  Service: Endoscopy;  Laterality: N/A;  . THYROIDECTOMY Right 11/01/2018   Procedure: RIGHT THYROIDECTOMY;  Surgeon: Graylin Shiver, MD;  Location: [facility] OR;  Service: ENT;  Laterality: Right;     Current Outpatient Medications  Medication Sig Dispense Refill  . atorvastatin (LIPITOR) 10 MG tablet Take 10 mg by mouth daily.    Marland Kitchen CALCIUM PO Take by mouth.    . gabapentin (NEURONTIN) 600 MG tablet Take by mouth in the morning, at noon, and at bedtime.    . Multiple Vitamins-Minerals (ONE-A-DAY 50 PLUS PO) Take by mouth.    . Omega-3 Fatty Acids (FISH OIL) 1000 MG CAPS Take 2,000 mg by mouth daily.      No current facility-administered medications for this visit.    Allergies:   Patient has no known allergies.    Social History:  The patient  reports that she has never smoked. She has  never used smokeless tobacco. She reports that she does not drink alcohol and does not use drugs.   Family History:  The patient's family history includes Heart attack in her father; Heart failure in her mother; Hypertension in her brother; Peripheral Artery Disease in her mother.    ROS:  Please see the history of present illness.   Otherwise, review of systems are positive for frustration that she still has the same chest sx after many years.   All other systems are reviewed and negative.    PHYSICAL EXAM: VS:  BP (!) 144/90   Pulse 79   Ht 5\' 6"  (1.676 m)   Wt 166 lb 6.4 oz (75.5 kg)   SpO2 98%   BMI 26.86 kg/m  , BMI Body mass index is 26.86 kg/m. GEN: Well nourished, well developed, in no acute distress  HEENT: normal  Neck:  no JVD, carotid bruits, or masses; scar on right neck for thyroid nodule Cardiac: RRR; no murmurs, rubs, or gallops,no edema  Respiratory:  clear to auscultation bilaterally, normal work of breathing GI: soft, nontender, nondistended, + BS MS: no deformity or atrophy  Skin: warm and dry, no rash Neuro:  Strength and sensation are intact Psych: euthymic mood, full affect   EKG:   The ekg ordered today demonstrates NSR, nonspecific ST changes   Recent Labs: No results found for requested labs within last 8760 hours.   Lipid Panel No results found for: CHOL, TRIG, HDL, CHOLHDL, VLDL, LDLCALC, LDLDIRECT   Other studies Reviewed: Additional studies/ records that were reviewed today with results demonstrating: labs reviewed; old cath results reviewed.   ASSESSMENT AND PLAN:  1. CAD: Persistent chest pain, worse with exertion.  Mild CAD in the past. Family h/o cardiac issues, brother with CHF.  Discussed CT in 2019 but this did not happen. If CT shows no severe blockage, could try  Imdur or verapamil to treat potential microvascular disease.  If significant CAD on CT, will need repeat cath.   2. Hyperlipidemia: LDL 175 in 4/21.  Atorvastatin was started in 4/21.  Needs a recheck.   3. Metoprolol 100 mg x 1 for CT scan.   Current medicines are reviewed at length with the patient today.  The patient concerns regarding her medicines were addressed.  The following changes have been made:  No change  Labs/ tests ordered today include:  No orders of the defined types were placed in this encounter.   Recommend 150 minutes/week of aerobic exercise Low fat, low carb, high fiber diet recommended  Disposition:   FU based on CT scan results   Signed, 5/21, MD  11/10/2019 1:58 PM    [facility] [address] [place], [place], [place]  [place] Phone: [phone]; Fax: [phone]

## 2019-11-10 ENCOUNTER — Encounter: Payer: Self-pay | Admitting: Interventional Cardiology

## 2019-11-10 ENCOUNTER — Ambulatory Visit (INDEPENDENT_AMBULATORY_CARE_PROVIDER_SITE_OTHER): Payer: BC Managed Care – PPO | Admitting: Interventional Cardiology

## 2019-11-10 ENCOUNTER — Other Ambulatory Visit: Payer: Self-pay

## 2019-11-10 VITALS — BP 144/90 | HR 79 | Ht 66.0 in | Wt 166.4 lb

## 2019-11-10 DIAGNOSIS — I25119 Atherosclerotic heart disease of native coronary artery with unspecified angina pectoris: Secondary | ICD-10-CM | POA: Diagnosis not present

## 2019-11-10 DIAGNOSIS — E782 Mixed hyperlipidemia: Secondary | ICD-10-CM

## 2019-11-10 DIAGNOSIS — R072 Precordial pain: Secondary | ICD-10-CM | POA: Diagnosis not present

## 2019-11-10 MED ORDER — METOPROLOL TARTRATE 100 MG PO TABS: ORAL_TABLET | ORAL | 0 refills | Status: DC

## 2019-11-10 NOTE — Patient Instructions (Addendum)
Medication Instructions:  Your physician recommends that you continue on your current medications as directed. Please refer to the Current Medication list given to you today.  *If you need a refill on your cardiac medications before your next appointment, please call your pharmacy*   Lab Work: None  If you have labs (blood work) drawn today and your tests are completely normal, you will receive your results only by: Marland Kitchen MyChart Message (if you have MyChart) OR . A paper copy in the mail If you have any lab test that is abnormal or we need to change your treatment, we will call you to review the results.   Testing/Procedures: Your physician has requested that you have cardiac CT.   Follow-Up: Based on test results   Other Instructions Your cardiac CT will be scheduled at one of the below locations:   [facility] [address] [place], [place] [address] [phone]  [place] [address] [place],  [address] [phone]  If scheduled at [facility], please arrive at the [facility] main entrance of [facility] 30 minutes prior to test start time. Proceed to the [facility] Radiology Department (first floor) to check-in and test prep.  If scheduled at [facility], please arrive 15 mins early for check-in and test prep.  Please follow these instructions carefully (unless otherwise directed):  On the Night Before the Test: . Be sure to Drink plenty of water. . Do not consume any caffeinated/decaffeinated beverages or chocolate 12 hours prior to your test. . Do not take any antihistamines 12 hours prior to your test.   On the Day of the Test: . Drink plenty of water. Do not drink any water within one hour of the test. . Do not eat any food 4 hours prior to the test. . You may take your regular medications prior to the test.  . Take metoprolol (Lopressor)  100 MG two hours prior to test. . FEMALES- please wear underwire-free bra if available      After the Test: . Drink plenty of water. . After receiving IV contrast, you may experience a mild flushed feeling. This is normal. . On occasion, you may experience a mild rash up to 24 hours after the test. This is not dangerous. If this occurs, you can take Benadryl 25 mg and increase your fluid intake. . If you experience trouble breathing, this can be serious. If it is severe call 911 IMMEDIATELY. If it is mild, please call our office.   Once we have confirmed authorization from your insurance company, we will call you to set up a date and time for your test. Based on how quickly your insurance processes prior authorizations requests, please allow up to 4 weeks to be contacted for scheduling your Cardiac CT appointment. Be advised that routine Cardiac CT appointments could be scheduled as many as 8 weeks after your provider has ordered it.  For non-scheduling related questions, please contact the cardiac imaging nurse navigator should you have any questions/concerns: Marchia Bond, Cardiac Imaging Nurse Navigator Burley Saver, Interim Cardiac Imaging Nurse [facility] Direct Office Dial: [phone]   For scheduling needs, including cancellations and rescheduling, please call Vivien Rota at [phone], option 3.

## 2019-11-18 ENCOUNTER — Other Ambulatory Visit: Payer: Self-pay

## 2019-11-18 DIAGNOSIS — I25119 Atherosclerotic heart disease of native coronary artery with unspecified angina pectoris: Secondary | ICD-10-CM

## 2019-11-19 ENCOUNTER — Other Ambulatory Visit: Payer: BC Managed Care – PPO | Admitting: *Deleted

## 2019-11-19 ENCOUNTER — Other Ambulatory Visit: Payer: Self-pay

## 2019-11-19 DIAGNOSIS — I25119 Atherosclerotic heart disease of native coronary artery with unspecified angina pectoris: Secondary | ICD-10-CM | POA: Diagnosis not present

## 2019-11-19 LAB — BASIC METABOLIC PANEL
BUN/Creatinine Ratio: 18 (ref 12–28)
BUN: 15 mg/dL (ref 8–27)
CO2: 26 mmol/L (ref 20–29)
Calcium: 9.6 mg/dL (ref 8.7–10.3)
Chloride: 103 mmol/L (ref 96–106)
Creatinine, Ser: 0.84 mg/dL (ref 0.57–1.00)
GFR calc Af Amer: 87 mL/min/{1.73_m2} (ref >59)
GFR calc non Af Amer: 76 mL/min/{1.73_m2} (ref >59)
Glucose: 104 mg/dL — ABNORMAL HIGH (ref 65–99)
Potassium: 4.5 mmol/L (ref 3.5–5.2)
Sodium: 140 mmol/L (ref 134–144)

## 2019-11-20 ENCOUNTER — Telehealth (HOSPITAL_COMMUNITY): Payer: Self-pay | Admitting: Emergency Medicine

## 2019-11-20 NOTE — Telephone Encounter (Signed)
Reaching out to patient to offer assistance regarding upcoming cardiac imaging study; pt verbalizes understanding of appt date/time, parking situation and where to check in, pre-test NPO status and medications ordered, and verified current allergies; name and call back number provided for further questions should they arise Clayburn Weekly RN Navigator Cardiac Imaging [facility] [phone] office [phone] cell 

## 2019-11-24 ENCOUNTER — Ambulatory Visit (HOSPITAL_COMMUNITY)
Admission: RE | Admit: 2019-11-24 | Discharge: 2019-11-24 | Disposition: A | Discharge status: Home / Self Care | Payer: BC Managed Care – PPO | Source: Ambulatory Visit | Attending: Interventional Cardiology | Admitting: Interventional Cardiology

## 2019-11-24 ENCOUNTER — Encounter (HOSPITAL_COMMUNITY): Payer: Self-pay

## 2019-11-24 ENCOUNTER — Other Ambulatory Visit: Payer: Self-pay

## 2019-11-24 DIAGNOSIS — R072 Precordial pain: Secondary | ICD-10-CM | POA: Diagnosis not present

## 2019-11-24 MED ORDER — IOHEXOL 350 MG/ML SOLN
80.0000 mL | Freq: Once | INTRAVENOUS | Status: AC | PRN
  Administered 2019-11-24: 80 mL via INTRAVENOUS

## 2019-11-24 MED ORDER — NITROGLYCERIN 0.4 MG SL SUBL
SUBLINGUAL_TABLET | SUBLINGUAL | Status: AC
  Administered 2019-11-24: 0.8 mg via SUBLINGUAL
  Filled 2019-11-24: qty 2

## 2019-11-24 MED ORDER — NITROGLYCERIN 0.4 MG SL SUBL: 0.8000 mg | SUBLINGUAL_TABLET | Freq: Once | SUBLINGUAL | Status: AC

## 2019-11-25 ENCOUNTER — Telehealth: Payer: Self-pay

## 2019-11-25 MED ORDER — ISOSORBIDE MONONITRATE ER 30 MG PO TB24: 30.0000 mg | ORAL_TABLET | Freq: Every day | ORAL | 3 refills | Status: DC

## 2019-11-25 NOTE — Telephone Encounter (Signed)
Pt verbalized understanding of her Cardiac score...she agrees with starting Imdur 30 mg daily and will call back in a few weeks to report if she has had any relief.

## 2019-11-25 NOTE — Telephone Encounter (Signed)
-----   Message from Corky Crafts, MD sent at 11/24/2019  5:19 PM EST ----- Calcium score was 0.  No significant CAD.  COntinue preventive therapy.  Can try Imdur 30 mg daily to see if she has microvascular disease that responds to vasodilators.

## 2019-12-02 DIAGNOSIS — Z6826 Body mass index (BMI) 26.0-26.9, adult: Secondary | ICD-10-CM | POA: Diagnosis not present

## 2019-12-02 DIAGNOSIS — Z01419 Encounter for gynecological examination (general) (routine) without abnormal findings: Secondary | ICD-10-CM | POA: Diagnosis not present

## 2019-12-02 DIAGNOSIS — Z1151 Encounter for screening for human papillomavirus (HPV): Secondary | ICD-10-CM | POA: Diagnosis not present

## 2019-12-02 DIAGNOSIS — Z1231 Encounter for screening mammogram for malignant neoplasm of breast: Secondary | ICD-10-CM | POA: Diagnosis not present

## 2019-12-29 ENCOUNTER — Other Ambulatory Visit: Payer: Self-pay | Admitting: Obstetrics and Gynecology

## 2019-12-29 DIAGNOSIS — N951 Menopausal and female climacteric states: Secondary | ICD-10-CM

## 2019-12-31 ENCOUNTER — Ambulatory Visit
Admission: RE | Admit: 2019-12-31 | Discharge: 2019-12-31 | Disposition: A | Discharge status: Home / Self Care | Payer: BC Managed Care – PPO | Source: Ambulatory Visit | Attending: Obstetrics and Gynecology | Admitting: Obstetrics and Gynecology

## 2019-12-31 ENCOUNTER — Other Ambulatory Visit: Payer: Self-pay

## 2019-12-31 DIAGNOSIS — M85852 Other specified disorders of bone density and structure, left thigh: Secondary | ICD-10-CM | POA: Diagnosis not present

## 2019-12-31 DIAGNOSIS — N951 Menopausal and female climacteric states: Secondary | ICD-10-CM

## 2019-12-31 DIAGNOSIS — Z780 Asymptomatic menopausal state: Secondary | ICD-10-CM | POA: Diagnosis not present

## 2020-02-26 ENCOUNTER — Encounter: Payer: Self-pay | Admitting: Neurology

## 2020-02-26 ENCOUNTER — Ambulatory Visit (INDEPENDENT_AMBULATORY_CARE_PROVIDER_SITE_OTHER): Payer: BC Managed Care – PPO | Admitting: Neurology

## 2020-02-26 VITALS — BP 139/92 | HR 78 | Ht 66.0 in | Wt 162.5 lb

## 2020-02-26 DIAGNOSIS — M542 Cervicalgia: Secondary | ICD-10-CM | POA: Diagnosis not present

## 2020-02-26 DIAGNOSIS — G8929 Other chronic pain: Secondary | ICD-10-CM | POA: Diagnosis not present

## 2020-02-26 DIAGNOSIS — R070 Pain in throat: Secondary | ICD-10-CM | POA: Diagnosis not present

## 2020-02-26 MED ORDER — DULOXETINE HCL 60 MG PO CPEP: 60.0000 mg | ORAL_CAPSULE | Freq: Every day | ORAL | 3 refills | Status: DC

## 2020-02-26 NOTE — Progress Notes (Signed)
Chief Complaint  Patient presents with  . New Patient (Initial Visit)    Referral for right-sided throat pain and globus sensation. She has already completed ENT and GI evaluations. Also, right-sided chest pain when walking inclines.    HISTORICAL  Chondra Boyde is a 60 year old female, seen in request by her primary care nurse practitioner brake, Greig Castilla for evaluation of right side throat discomfort, pain, initial evaluation was on February 26, 2020  I reviewed and summarized the referring note.  Past medical history hyperlipidemia, on Lipitor 10 mg daily  Since August 2020, she began to noticed right sided throat pain, she woke up with the discomfort, like something is squeezing her throat, especially at the right side, can be painful, she described 10 out of 10 pain initially, she has difficulty sleeping, even eating sometimes,  Since its onset, she has a variable degree of pain, but the pain never went away, only mild sensation on the left side, sometimes the pain also radiating towards her right ear, felt most burning pain, edema involving her right,  For the past couple years, she has done extensive evaluation, barium swallowing study November 2020, borderline nonspecific esophageal motility disorder, with normal esophageal function evident on 3/5 swallows, chest x-ray showed no cardiopulmonary disease  Incidental finding of enlarged right side thyroid, with thyroid nodule, she had right thyroid lobectomy in October 2020, 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, no evidence of malignancy  Thyroid surgery due to improved course symptoms, actually over the past few months, she complains of worsening painful pressure sensation on the right side of her throat, had a GI evaluation, endoscopy showed no significant abnormality, was treated with omeprazole for few months without help, gabapentin up to 600 mg 3 times daily showed no significant  improvement,  She also underwent cardiac evaluation, CT of coronary artery showed no significant abnormality low risk for future cardiac events  Recent even PT evaluation showed no significant abnormalities  She denies sensory loss of her face, denies difficulty swallowing water, she does complains of anxiety concerning her body symptoms, also diffuse body achy pain occasionally, she also complains of chronic neck pain, radiating pain to bilateral shoulder,  REVIEW OF SYSTEMS: Full 14 system review of systems performed and notable only for as above All other review of systems were negative.  ALLERGIES: No Known Allergies  HOME MEDICATIONS: Current Outpatient Medications  Medication Sig Dispense Refill  . atorvastatin (LIPITOR) 10 MG tablet Take 10 mg by mouth daily.    . Cholecalciferol (VITAMIN D3 PO) Take 2,000 Units by mouth daily.     No current facility-administered medications for this visit.    PAST MEDICAL HISTORY: Past Medical History:  Diagnosis Date  . Cough   . Globus sensation   . HLD (hyperlipidemia)   . Throat pain     PAST SURGICAL HISTORY: Past Surgical History:  Procedure Laterality Date  . CARDIAC CATHETERIZATION N/A 12/07/2015   Procedure: Left Heart Cath and Coronary Angiography;  Surgeon: Lyn Records, MD;  Location: [facility] INVASIVE CV LAB;  Service: Cardiovascular;  Laterality: N/A;  . DILATION AND CURETTAGE OF UTERUS    . DILATION AND CURETTAGE, DIAGNOSTIC / THERAPEUTIC    . ESOPHAGEAL MANOMETRY N/A 03/05/2019   Procedure: ESOPHAGEAL MANOMETRY (EM);  Surgeon: Charna Elizabeth, MD;  Location: [facility] ENDOSCOPY;  Service: Endoscopy;  Laterality: N/A;  . PH IMPEDANCE STUDY N/A 03/05/2019   Procedure: PH IMPEDANCE STUDY;  Surgeon: Charna Elizabeth, MD;  Location: [facility]  ENDOS[facility];  Service: Endoscopy;  Laterality: N/A;  . THYROIDECTOMY Right 11/01/2018   Procedure: RIGHT THYROIDECTOMY;  Surgeon: Graylin Shiver, MD;  Location[facility] OR;  Service: ENT;  Laterality: Right;     FAMILY HISTORY: Family History  Problem Relation Age of Onset  . Heart failure Mother   . Peripheral Artery Disease Mother   . Heart attack Father   . Hypertension Brother   . Diabetes Brother   . Congestive Heart Failure Brother     SOCIAL HISTORY: Social History   Socioeconomic History  . Marital status: Married    Spouse name: Not on file  . Number of children: 3  . Years of education: college  . Highest education level: Bachelor's degree (e.g., BA, AB, BS)  Occupational History  . Occupation: Retired  Tobacco Use  . Smoking status: Never Smoker  . Smokeless tobacco: Never Used  Vaping Use  . Vaping Use: Never used  Substance and Sexual Activity  . Alcohol use: No  . Drug use: No  . Sexual activity: Not on file  Other Topics Concern  . Not on file  Social History Narrative   Lives at home with husband, son and brother-in-law.   Right-handed.   No daily caffeine use.   Social Determinants of Health   Financial Resource Strain: Not on file  Food Insecurity: Not on file  Transportation Needs: Not on file  Physical Activity: Not on file  Stress: Not on file  Social Connections: Not on file  Intimate Partner Violence: Not on file     PHYSICAL EXAM   Vitals:   02/26/20 0925  BP: (!) 139/92  Pulse: 78  Weight: 162 lb 8 oz (73.7 kg)  Height: 5\' 6"  (1.676 m)   Not recorded     Body mass index is 26.23 kg/m.  PHYSICAL EXAMNIATION:  Gen: NAD, conversant, well nourised, well groomed                     Cardiovascular: Regular rate rhythm, no peripheral edema, warm, nontender. Eyes: Conjunctivae clear without exudates or hemorrhage Neck: Supple, no carotid bruits. Pulmonary: Clear to auscultation bilaterally   NEUROLOGICAL EXAM:  MENTAL STATUS: Speech:    Speech is normal; fluent and spontaneous with normal comprehension.  Cognition:     Orientation to time, place and person     Normal recent and remote memory     Normal Attention span and  concentration     Normal Language, naming, repeating,spontaneous speech     Fund of knowledge   CRANIAL NERVES: CN II: Visual fields are full to confrontation. Pupils are round equal and briskly reactive to light. CN III, IV, VI: extraocular movement are normal. No ptosis. CN V: Facial sensation is intact to light touch, bilateral corneal reflexes were normal and symmetric CN VII: Face is symmetric with normal eye closure  CN VIII: Hearing is normal to causal conversation. CN IX, X: Phonation is norma, symmetric reflex CN XI: Head turning and shoulder shrug are intact  MOTOR: There is no pronator drift of out-stretched arms. Muscle bulk and tone are normal. Muscle strength is normal.  REFLEXES: Reflexes are 2+ and symmetric at the biceps, triceps, knees, and ankles. Plantar responses are flexor.  SENSORY: Intact to light touch, pinprick and vibratory sensation are intact in fingers and toes.  COORDINATION: There is no trunk or limb dysmetria noted.  GAIT/STANCE: Posture is normal. Gait is steady with normal steps, base, arm swing, and turning. Heel  and toe walking are normal. Tandem gait is normal.  Romberg is absent.   DIAGNOSTIC DATA (LABS, IMAGING, TESTING) - I reviewed patient records, labs, notes, testing and imaging myself where available.   ASSESSMENT AND PLAN  Kerry Odonohue is a 60 y.o. female   Right side throat pain, Chronic neck pain radiating pain to bilateral shoulder  Essentially normal neurological examination  Unsure etiology, she consistently extensive evaluations,  Will proceed with MRI of cervical spine, CT cervical soft tissue to rule out structural abnormalities  Laboratory for inflammatory markers  Empirically treat with Cymbalta 60 mg daily   Levert Feinstein, M.D. Ph.D.  [facility] [address] [place], [place] [address] Ph: [phone] Fax: [phone]  CC:  Soundra Pilon, FNP [address] [place],  [place] [address]

## 2020-02-27 LAB — CBC WITH DIFFERENTIAL/PLATELET
Basophils Absolute: 0 10*3/uL (ref 0.0–0.2)
Basos: 1 %
EOS (ABSOLUTE): 0 10*3/uL (ref 0.0–0.4)
Eos: 1 %
Hematocrit: 44 % (ref 34.0–46.6)
Hemoglobin: 13.8 g/dL (ref 11.1–15.9)
Immature Grans (Abs): 0 10*3/uL (ref 0.0–0.1)
Immature Granulocytes: 0 %
Lymphocytes Absolute: 1.8 10*3/uL (ref 0.7–3.1)
Lymphs: 39 %
MCH: 23 pg — ABNORMAL LOW (ref 26.6–33.0)
MCHC: 31.4 g/dL — ABNORMAL LOW (ref 31.5–35.7)
MCV: 73 fL — ABNORMAL LOW (ref 79–97)
Monocytes Absolute: 0.3 10*3/uL (ref 0.1–0.9)
Monocytes: 6 %
Neutrophils Absolute: 2.5 10*3/uL (ref 1.4–7.0)
Neutrophils: 53 %
Platelets: 281 10*3/uL (ref 150–450)
RBC: 6 x10E6/uL — ABNORMAL HIGH (ref 3.77–5.28)
RDW: 16.5 % — ABNORMAL HIGH (ref 11.7–15.4)
WBC: 4.6 10*3/uL (ref 3.4–10.8)

## 2020-02-27 LAB — FERRITIN: Ferritin: 380 ng/mL — ABNORMAL HIGH (ref 15–150)

## 2020-02-27 LAB — HEPATIC FUNCTION PANEL
ALT: 17 IU/L (ref 0–32)
AST: 25 IU/L (ref 0–40)
Albumin: 4.9 g/dL (ref 3.8–4.9)
Alkaline Phosphatase: 60 IU/L (ref 44–121)
Bilirubin Total: 0.8 mg/dL (ref 0.0–1.2)
Bilirubin, Direct: 0.18 mg/dL (ref 0.00–0.40)
Total Protein: 7.5 g/dL (ref 6.0–8.5)

## 2020-02-27 LAB — VITAMIN D 25 HYDROXY (VIT D DEFICIENCY, FRACTURES): Vit D, 25-Hydroxy: 36.3 ng/mL (ref 30.0–100.0)

## 2020-02-27 LAB — VITAMIN B12: Vitamin B-12: 986 pg/mL (ref 232–1245)

## 2020-02-27 LAB — TSH: TSH: 2.14 u[IU]/mL (ref 0.450–4.500)

## 2020-03-01 ENCOUNTER — Telehealth: Payer: Self-pay | Admitting: Neurology

## 2020-03-01 ENCOUNTER — Encounter: Payer: Self-pay | Admitting: Neurology

## 2020-03-01 NOTE — Telephone Encounter (Signed)
When you get a chance can you finish your notes. Thank you!

## 2020-03-01 NOTE — Telephone Encounter (Signed)
Please call patient, laboratory evaluation showed elevated ferritin, has unknown clinical significance, ferritin is inflammatory marker, may consider repeat laboratory evaluation in few months, rest of the laboratory evaluation showed no significant abnormalities.

## 2020-03-01 NOTE — Telephone Encounter (Signed)
I called the pt and advised of results. Pt wanted to know if the increased ferritin level could be related to her chronic throat pain and due to the thyroid nodule she had removed?

## 2020-03-03 NOTE — Telephone Encounter (Signed)
I do not think elevated ferritin level is related to her symptoms, ferritin indicate iron storage, is also an inflammatory markers.  May consider repeat laboratory evaluations at next follow-up visit or by her primary care physician, I have faxed the report to her primary care physician. Soundra Pilon, FNP

## 2020-03-03 NOTE — Telephone Encounter (Signed)
I called the and reviewed Dr. Terrace Arabia message. Pt verbalized understanding but still is concerned about this elevated level. Pt is agreeable to rechecking lab in the future. She did report the Duloxetine is helping with her sx a little bit and wanted Dr. Terrace Arabia to know.

## 2020-03-04 NOTE — Telephone Encounter (Signed)
Noted, thank you!!  [employer] [facility] Harvey: [phone] (exp. 03/04/20 to 08/30/20) & [phone] (exp. 03/04/20 to 08/30/20) order sent to GI. They will reach out to the patient to schedule.

## 2020-03-19 ENCOUNTER — Ambulatory Visit
Admission: RE | Admit: 2020-03-19 | Discharge: 2020-03-19 | Disposition: A | Discharge status: Home / Self Care | Payer: BC Managed Care – PPO | Source: Ambulatory Visit | Attending: Neurology | Admitting: Neurology

## 2020-03-19 ENCOUNTER — Other Ambulatory Visit: Payer: Self-pay | Admitting: Neurology

## 2020-03-19 DIAGNOSIS — M542 Cervicalgia: Secondary | ICD-10-CM

## 2020-03-22 ENCOUNTER — Other Ambulatory Visit: Payer: Self-pay

## 2020-03-22 ENCOUNTER — Telehealth: Payer: Self-pay | Admitting: Neurology

## 2020-03-22 ENCOUNTER — Ambulatory Visit
Admission: RE | Admit: 2020-03-22 | Discharge: 2020-03-22 | Disposition: A | Discharge status: Home / Self Care | Payer: BC Managed Care – PPO | Source: Ambulatory Visit | Attending: Neurology | Admitting: Neurology

## 2020-03-22 DIAGNOSIS — R070 Pain in throat: Secondary | ICD-10-CM | POA: Diagnosis not present

## 2020-03-22 DIAGNOSIS — E890 Postprocedural hypothyroidism: Secondary | ICD-10-CM | POA: Diagnosis not present

## 2020-03-22 DIAGNOSIS — G8929 Other chronic pain: Secondary | ICD-10-CM

## 2020-03-22 DIAGNOSIS — M542 Cervicalgia: Secondary | ICD-10-CM

## 2020-03-22 DIAGNOSIS — M50322 Other cervical disc degeneration at C5-C6 level: Secondary | ICD-10-CM | POA: Diagnosis not present

## 2020-03-22 MED ORDER — IOPAMIDOL (ISOVUE-300) INJECTION 61%
75.0000 mL | Freq: Once | INTRAVENOUS | Status: AC | PRN
  Administered 2020-03-22: 75 mL via INTRAVENOUS

## 2020-03-22 NOTE — Telephone Encounter (Addendum)
Open in error

## 2020-03-22 NOTE — Telephone Encounter (Signed)
I spoke to the patient and provided her with MRI cervical results. She verbalized understanding. Feels duloxetine 60mg  daily is beneficial for her symptoms.

## 2020-03-22 NOTE — Telephone Encounter (Signed)
  IMPRESSION: This MRI of the cervical spine without contrast shows the following: 1.   The spinal cord appears normal. 2.   At C5-C6 there is reduced disc height, disc protrusion, uncovertebral spurring and mild right facet hypertrophy causing mild spinal stenosis and moderate bilateral foraminal narrowing.  There does not appear to be nerve root compression. 3.  At C6-C7, there is mild disc bulging and a small left disc osteophyte complex causing moderate left foraminal narrowing but no nerve root compression.  Please call patient MRI of cervical spine showed multilevel degenerative changes, most severe at C5-6, C6-7, no evidence of spinal cord narrowing, variable degree of foraminal narrowing

## 2020-03-22 NOTE — Telephone Encounter (Signed)
  IMPRESSION: This MRI of the cervical spine without contrast shows the following: 1.   The spinal cord appears normal. 2.   At C5-C6 there is reduced disc height, disc protrusion, uncovertebral spurring and mild right facet hypertrophy causing mild spinal stenosis and moderate bilateral foraminal narrowing.  There does not appear to be nerve root compression. 3.  At C6-C7, there is mild disc bulging and a small left disc osteophyte complex causing moderate left foraminal narrowing but no nerve root compression.  Please call patient, MRI of cervical spine showed multilevel mild degenerative changes, there was no evidence of spinal canal or nerve root compression.

## 2020-04-01 IMAGING — US US FNA BIOPSY THYROID 1ST LESION
1 series · 13 of 23 positions shown · non-contrast
Comparison: Thyroid ultrasound performed 09/20/2018

MEDICATIONS:
None

COMPLICATIONS:
None immediate.

INDICATION: Indeterminate right mid thyroid nodule

EXAM:
ULTRASOUND GUIDED FINE NEEDLE ASPIRATION OF INDETERMINATE RIGHT MID
THYROID NODULE
TECHNIQUE: Informed written consent was obtained from the patient after a
discussion of the risks, benefits and alternatives to treatment.
Questions regarding the procedure were encouraged and answered. A
timeout was performed prior to the initiation of the procedure.

[Series 1: us fna biopsy thyroid 1st lesion · 0.06mm/px · 23 acquisitions, 13 frames shown]
[im 1/23]
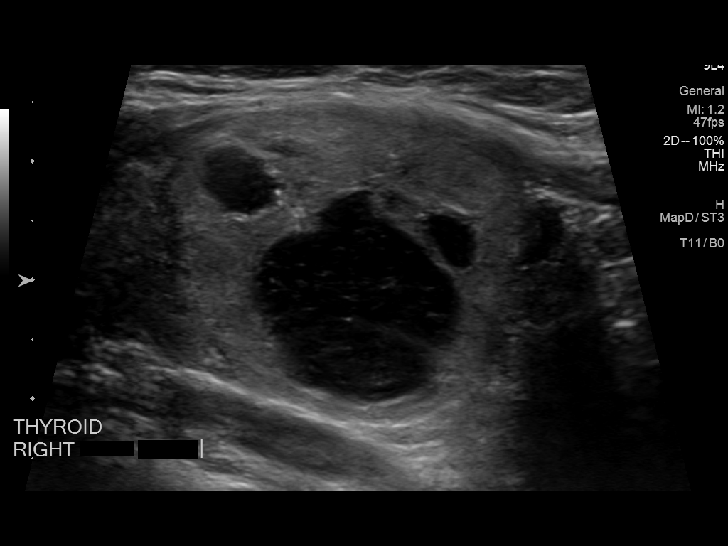
[im 3/23]
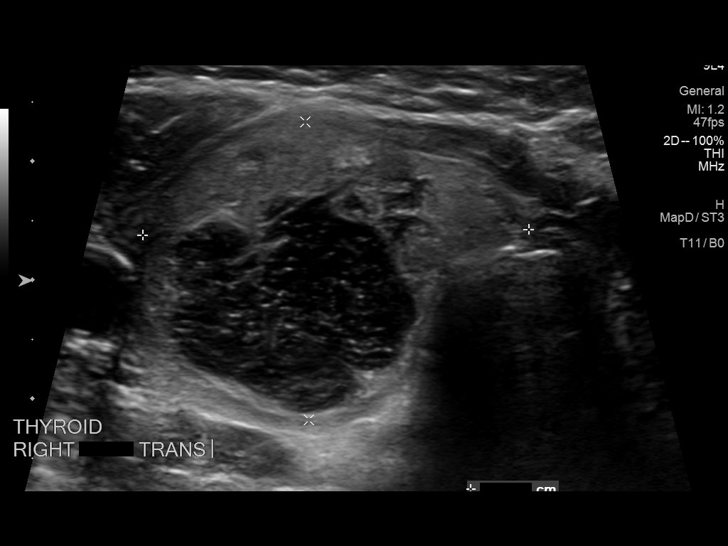
[im 5/23]
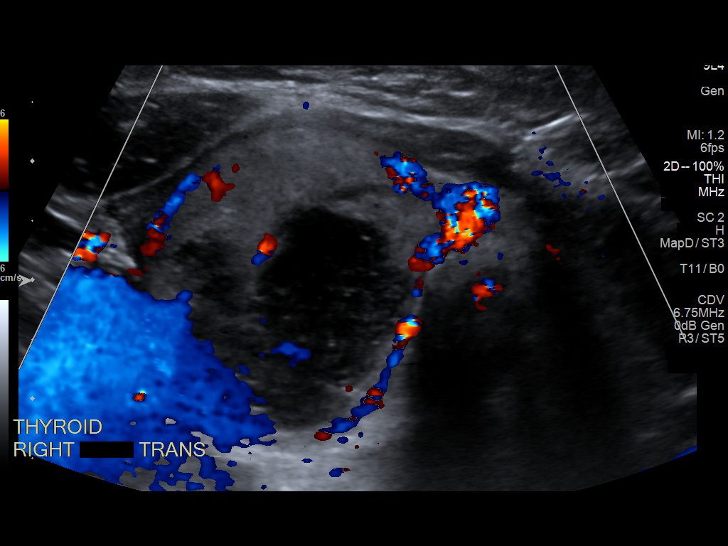
[im 7/23]
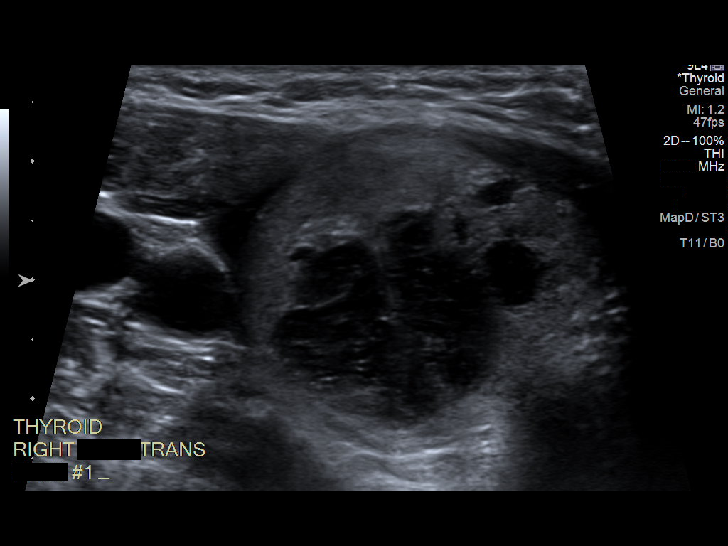
[im 8/23]
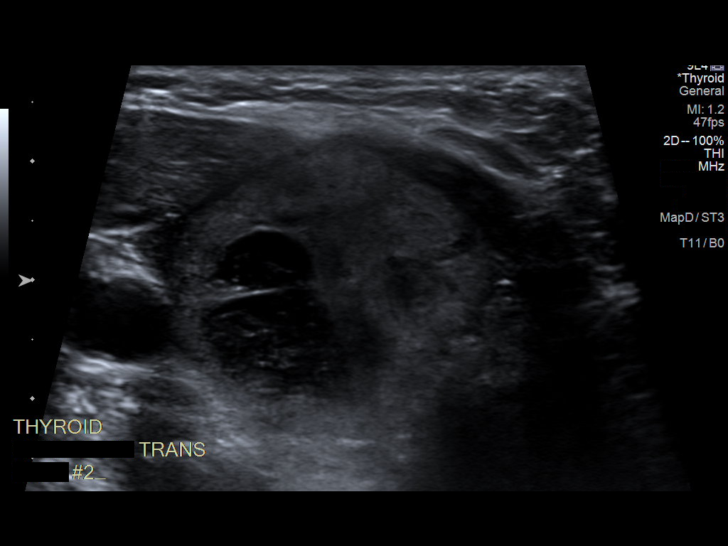
[im 10/23]
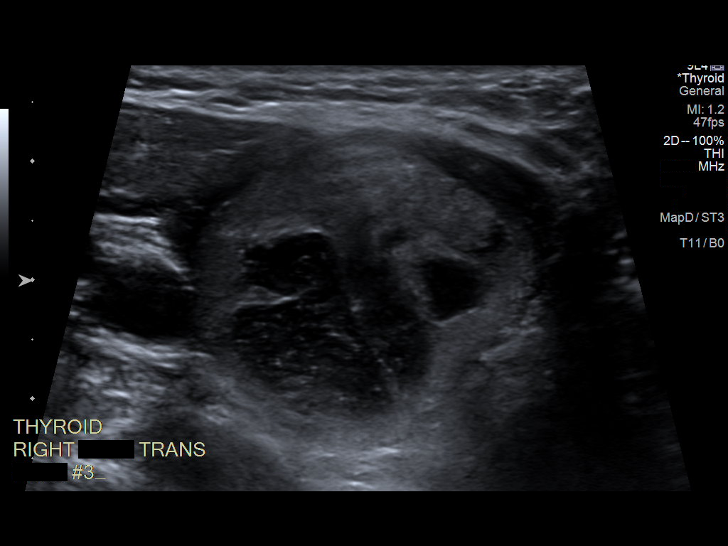
[im 12/23]
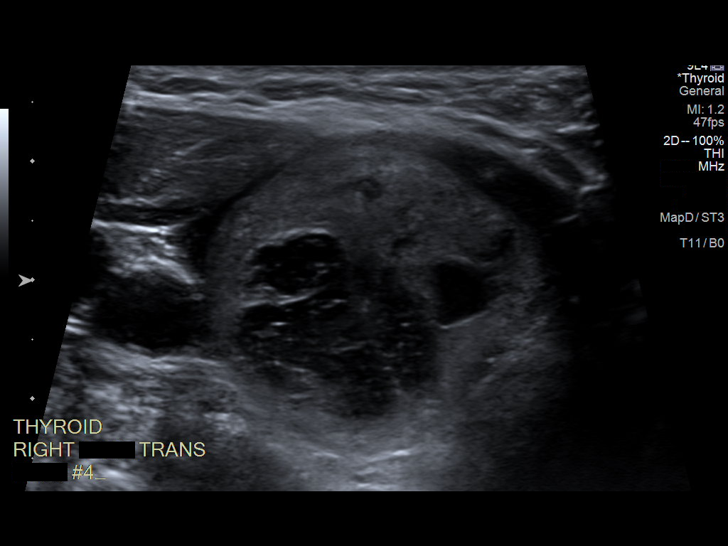
[im 14/23]
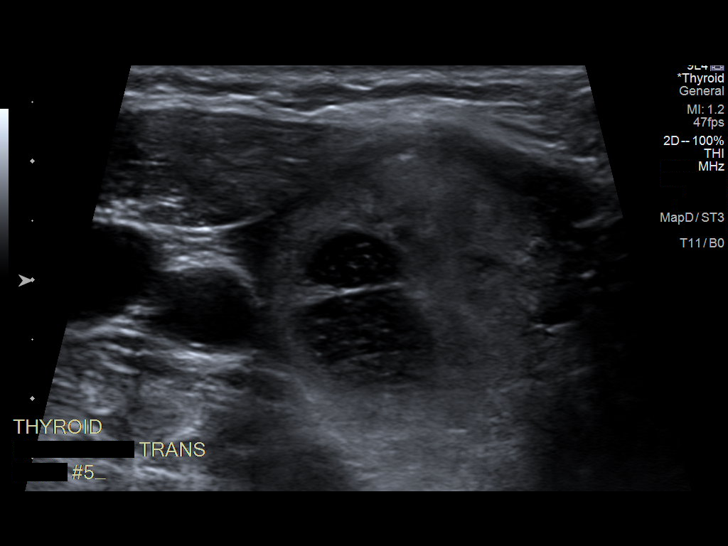
[im 16/23]
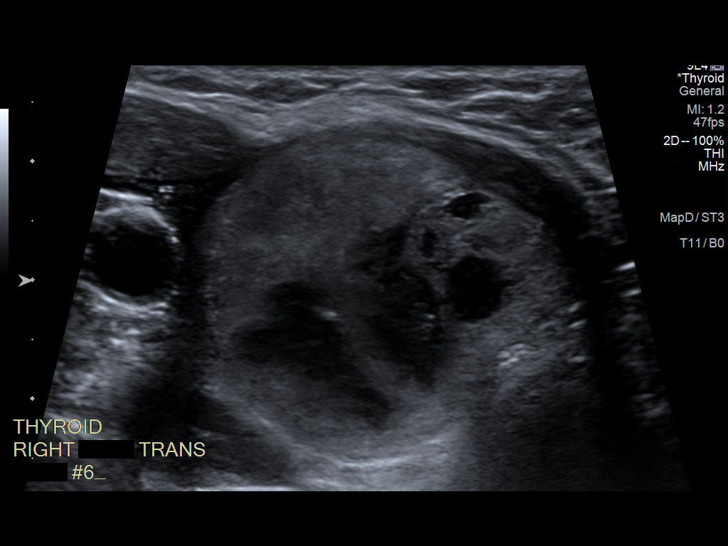
[im 17/23]
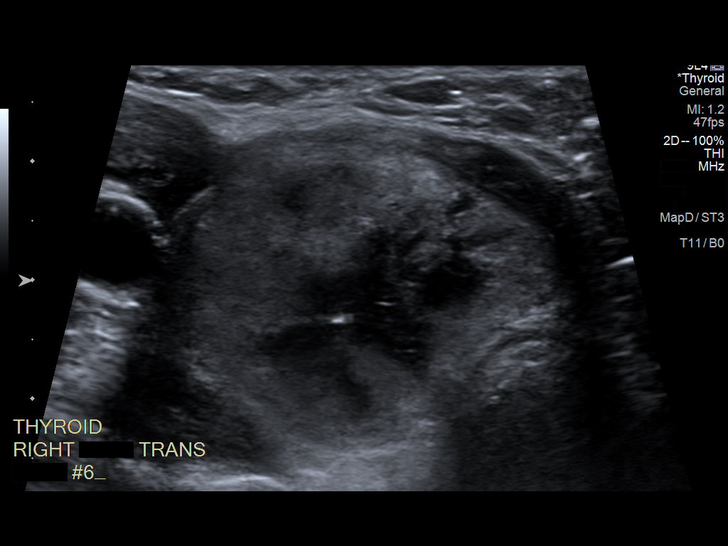
[im 19/23]
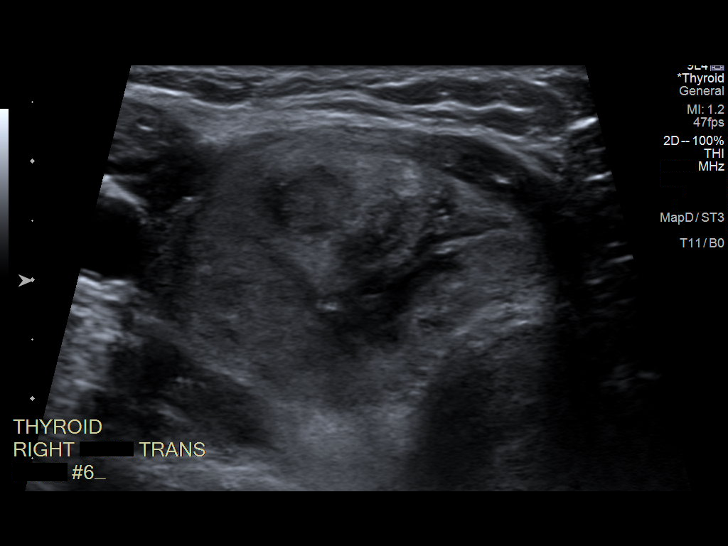
[im 21/23]
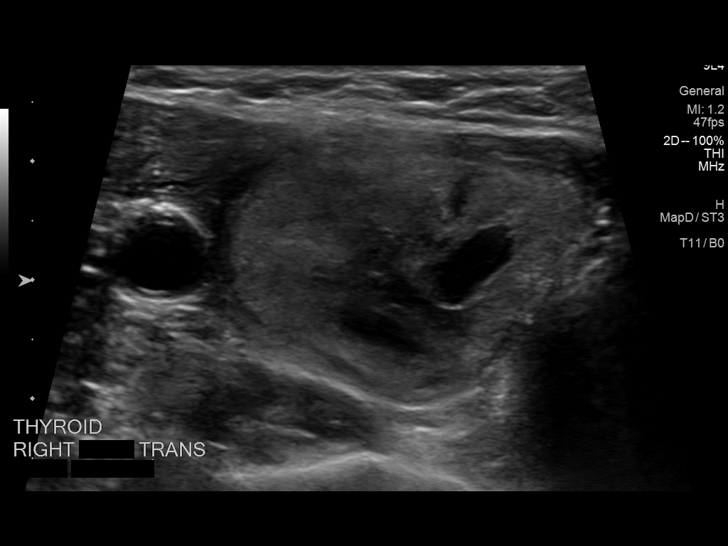
[im 23/23]
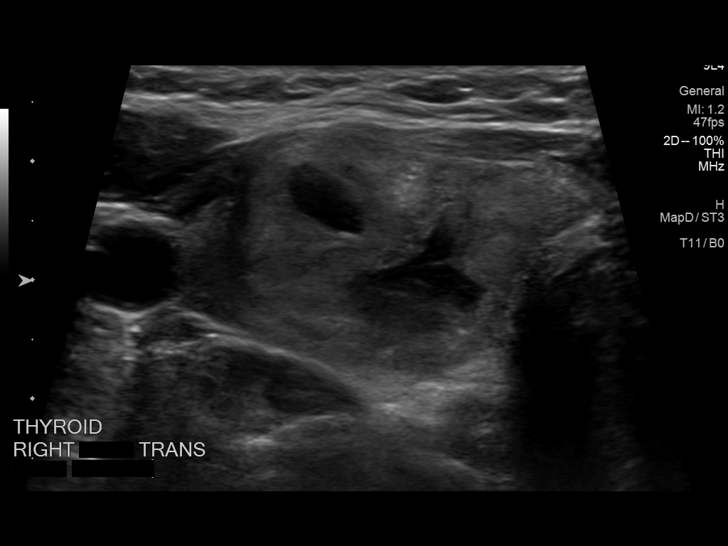

[13 of 23 positions shown; findings below may reference images not displayed]

Pre-procedural ultrasound scanning demonstrated unchanged size and
appearance of the indeterminate nodule within the right mid thyroid
lobe.

The procedure was planned. The neck was prepped in the usual sterile
fashion, and a sterile drape was applied covering the operative
field. A timeout was performed prior to the initiation of the
procedure. Local anesthesia was provided with 1% lidocaine.

Under direct ultrasound guidance, 5 FNA biopsies were performed of
the right mid thyroid nodule with a 25 gauge needle. Multiple
ultrasound images were saved for procedural documentation purposes.
The samples were prepared and submitted to pathology.

Following this, an 18 gauge needle was inserted and approximately
3.5 mL of dark cystic fluid was aspirated.

Limited post procedural scanning was negative for hematoma or
additional complication. Dressings were placed. The patient
tolerated the above procedures procedure well without immediate
postprocedural complication.
FINDINGS: FINDINGS
Nodule reference number based on prior diagnostic ultrasound: 1

Maximum size: 3.2 cm

Location: Right  ;  Mid

ACR TI-RADS total points: 3

ACR TI-RADS risk category:  TR3 (3 points)

Prior biopsy:  No

Reason for biopsy: meets ACR TI-RADS criteria

Ultrasound imaging confirms appropriate placement of the needles
within the thyroid nodule.
IMPRESSION: Technically successful ultrasound guided fine needle aspiration of
right mid thyroid nodule as described above.

## 2020-04-21 IMAGING — CR DG CHEST 2V
2 series · 2 of 2 positions shown · non-contrast
Comparison: None.

CLINICAL DATA: Preop exam for right thyroidectomy.

EXAM:
CHEST - 2 VIEW

[w chest pa]
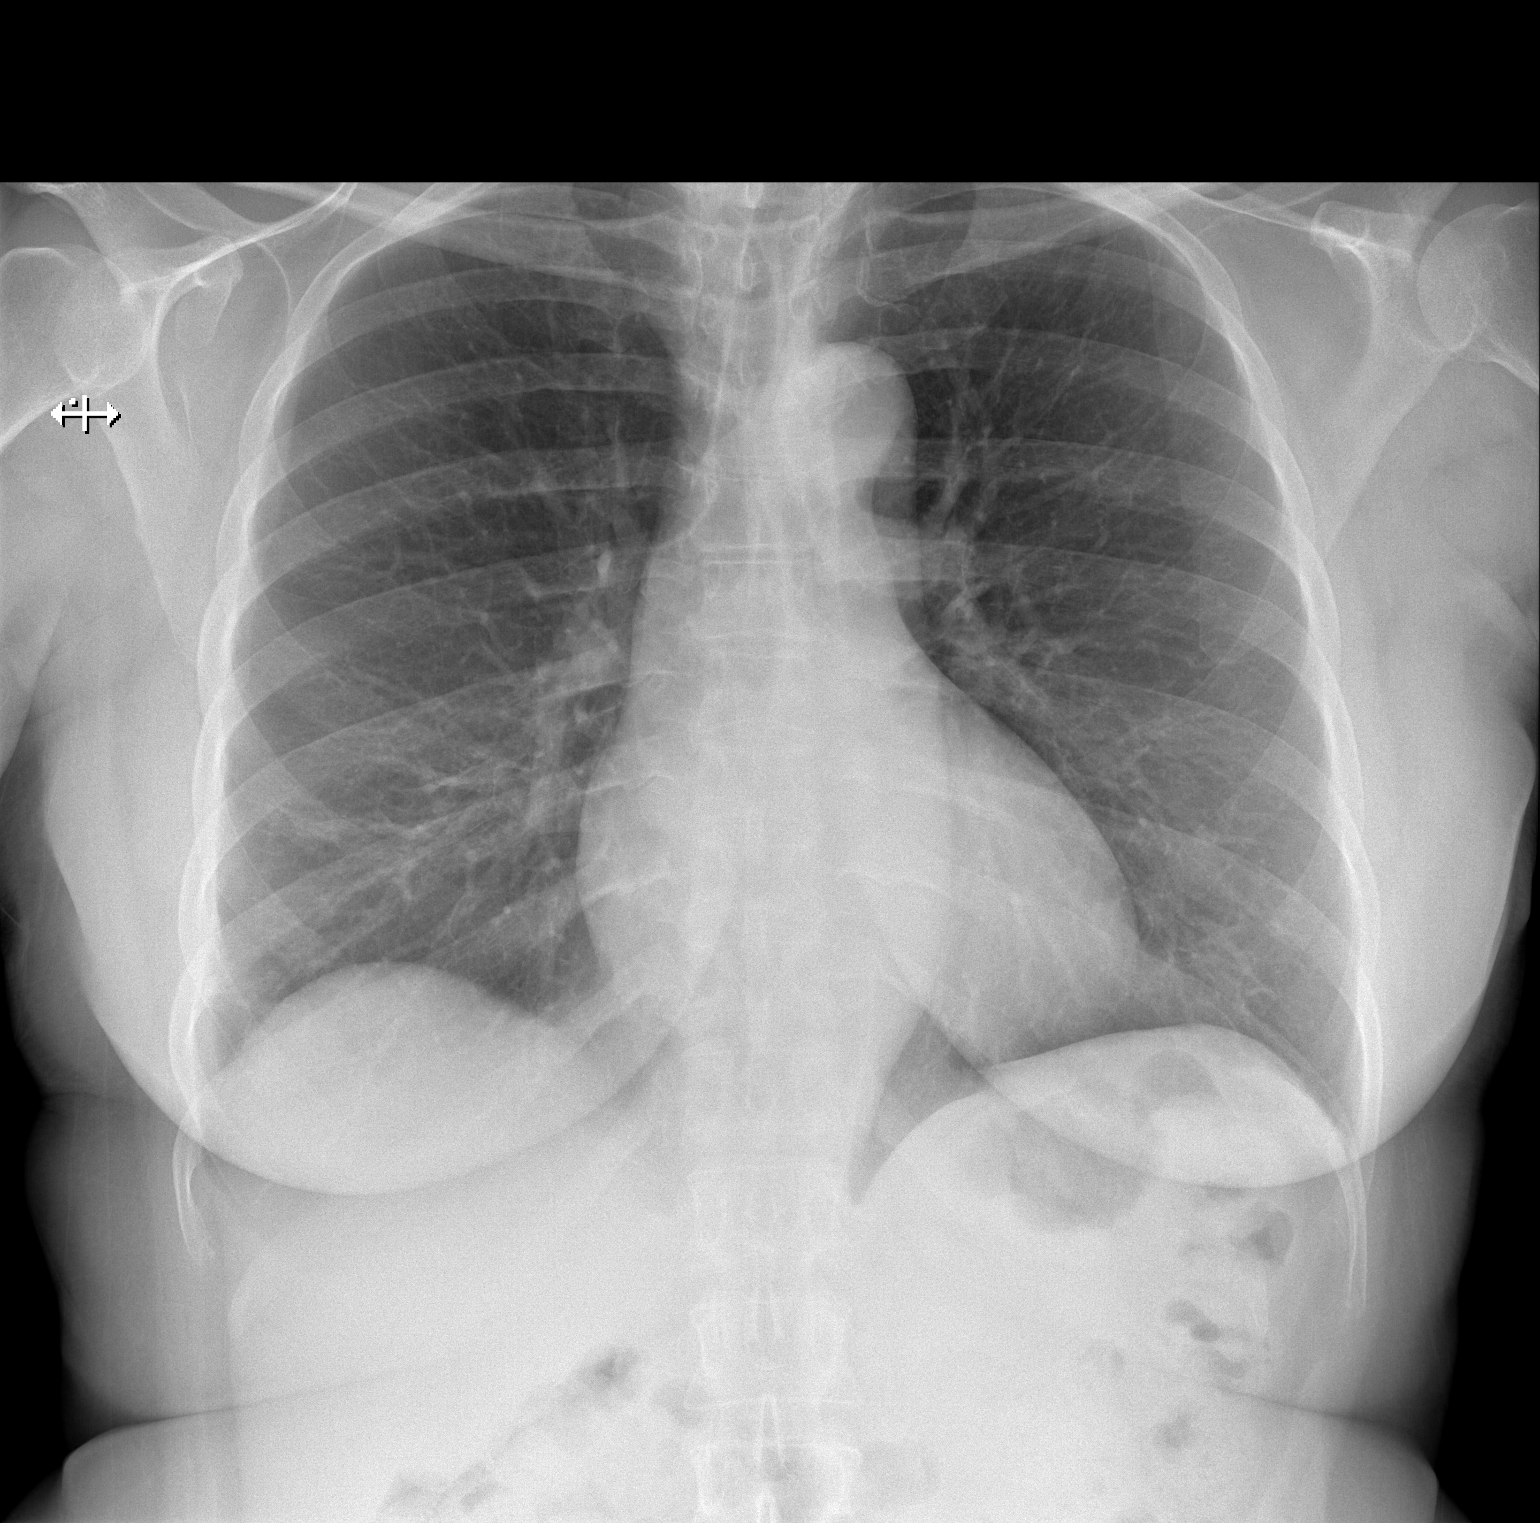

[w chest lat]
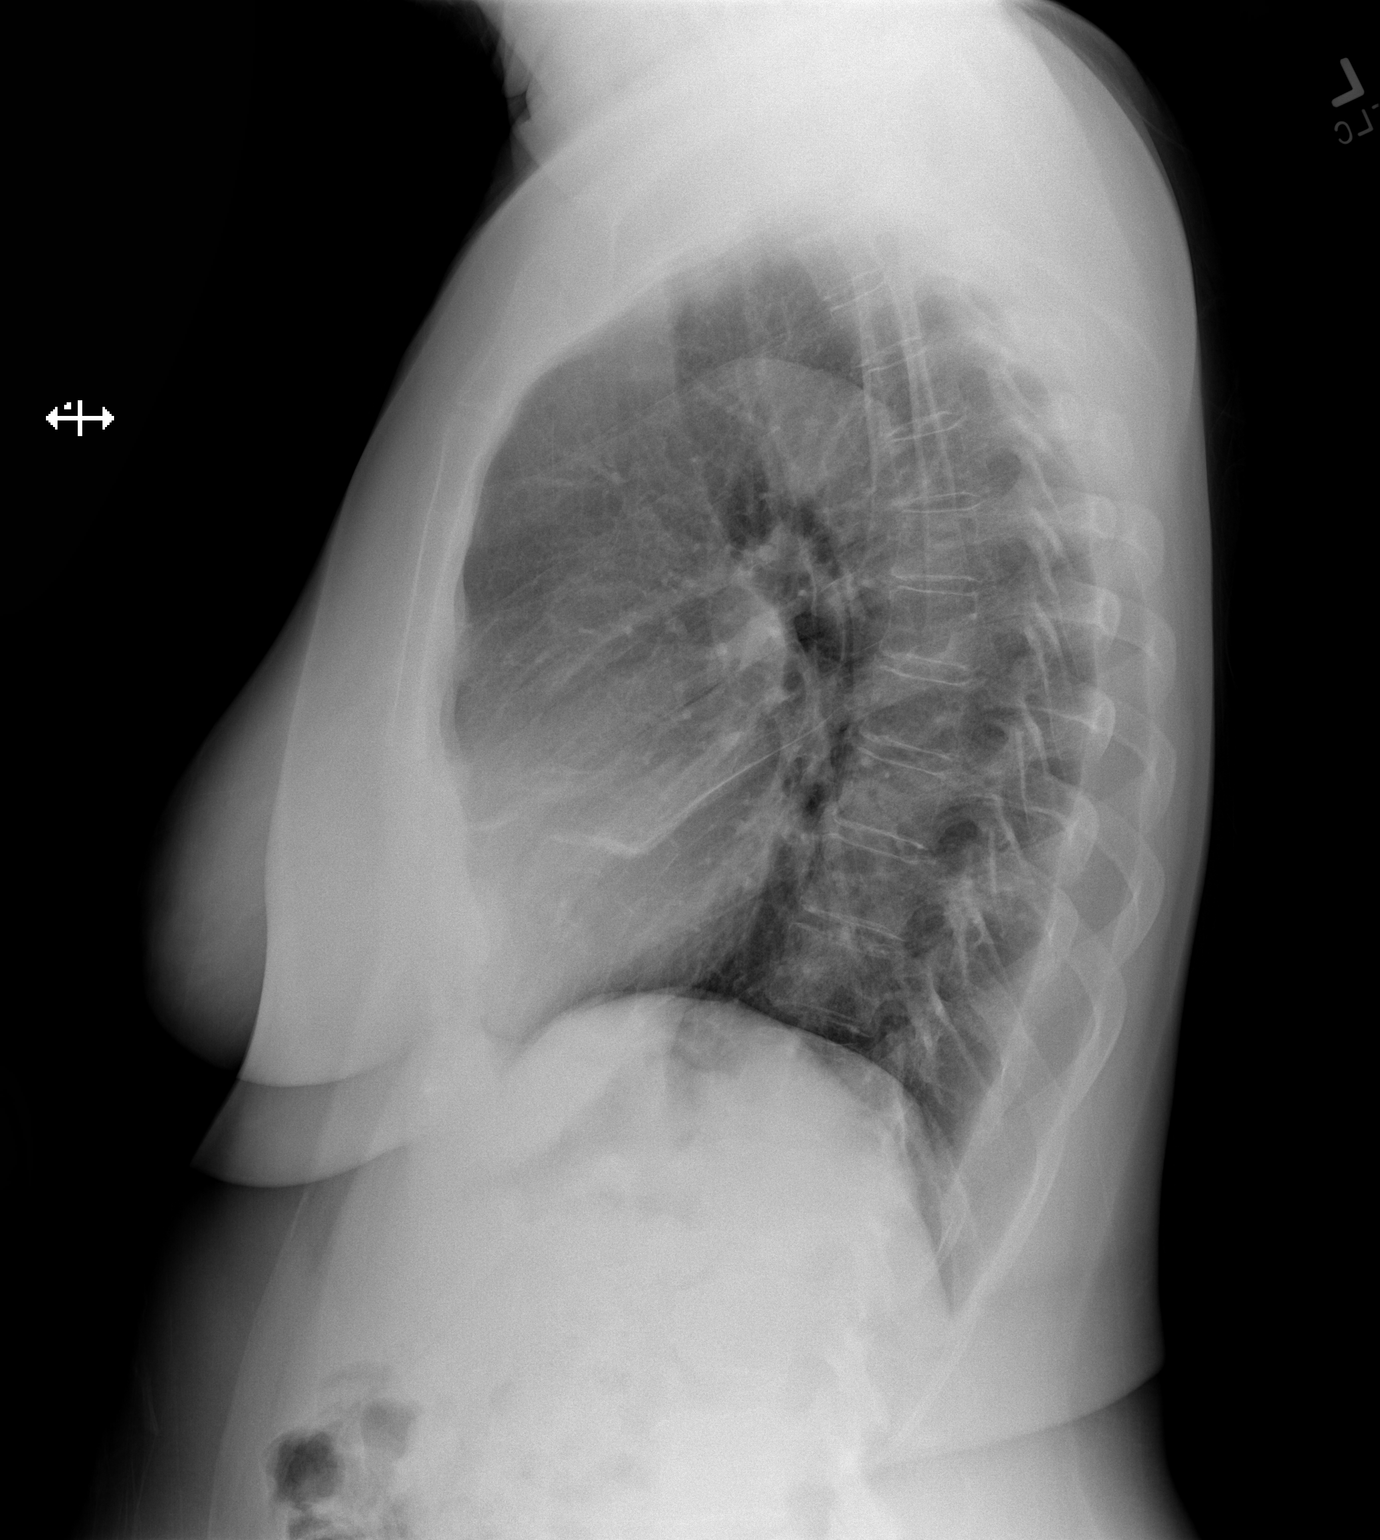

[2 of 2 positions shown; findings below may reference images not displayed]

FINDINGS: The heart size and mediastinal contours are within normal limits.
Both lungs are clear. The visualized skeletal structures are
unremarkable.
IMPRESSION: No active cardiopulmonary disease.

## 2020-04-27 DIAGNOSIS — Z0000 Encounter for general adult medical examination without abnormal findings: Secondary | ICD-10-CM | POA: Diagnosis not present

## 2020-04-27 DIAGNOSIS — E559 Vitamin D deficiency, unspecified: Secondary | ICD-10-CM | POA: Diagnosis not present

## 2020-04-27 DIAGNOSIS — Z23 Encounter for immunization: Secondary | ICD-10-CM | POA: Diagnosis not present

## 2020-04-27 DIAGNOSIS — E785 Hyperlipidemia, unspecified: Secondary | ICD-10-CM | POA: Diagnosis not present

## 2020-04-27 DIAGNOSIS — R7303 Prediabetes: Secondary | ICD-10-CM | POA: Diagnosis not present

## 2020-04-29 DIAGNOSIS — H40023 Open angle with borderline findings, high risk, bilateral: Secondary | ICD-10-CM | POA: Diagnosis not present

## 2020-08-26 DIAGNOSIS — R42 Dizziness and giddiness: Secondary | ICD-10-CM | POA: Diagnosis not present

## 2020-08-26 DIAGNOSIS — Z23 Encounter for immunization: Secondary | ICD-10-CM | POA: Diagnosis not present

## 2020-12-02 DIAGNOSIS — H2513 Age-related nuclear cataract, bilateral: Secondary | ICD-10-CM | POA: Diagnosis not present

## 2020-12-02 DIAGNOSIS — H40023 Open angle with borderline findings, high risk, bilateral: Secondary | ICD-10-CM | POA: Diagnosis not present

## 2020-12-02 DIAGNOSIS — H35371 Puckering of macula, right eye: Secondary | ICD-10-CM | POA: Diagnosis not present

## 2021-01-13 DIAGNOSIS — Z01419 Encounter for gynecological examination (general) (routine) without abnormal findings: Secondary | ICD-10-CM | POA: Diagnosis not present

## 2021-01-13 DIAGNOSIS — Z6824 Body mass index (BMI) 24.0-24.9, adult: Secondary | ICD-10-CM | POA: Diagnosis not present

## 2021-01-13 DIAGNOSIS — Z113 Encounter for screening for infections with a predominantly sexual mode of transmission: Secondary | ICD-10-CM | POA: Diagnosis not present

## 2021-01-13 DIAGNOSIS — Z124 Encounter for screening for malignant neoplasm of cervix: Secondary | ICD-10-CM | POA: Diagnosis not present

## 2021-01-13 DIAGNOSIS — Z1231 Encounter for screening mammogram for malignant neoplasm of breast: Secondary | ICD-10-CM | POA: Diagnosis not present

## 2021-05-03 DIAGNOSIS — E785 Hyperlipidemia, unspecified: Secondary | ICD-10-CM | POA: Diagnosis not present

## 2021-05-03 DIAGNOSIS — R7303 Prediabetes: Secondary | ICD-10-CM | POA: Diagnosis not present

## 2021-05-03 DIAGNOSIS — Z0000 Encounter for general adult medical examination without abnormal findings: Secondary | ICD-10-CM | POA: Diagnosis not present

## 2021-05-03 DIAGNOSIS — E559 Vitamin D deficiency, unspecified: Secondary | ICD-10-CM | POA: Diagnosis not present

## 2021-07-22 DIAGNOSIS — H40023 Open angle with borderline findings, high risk, bilateral: Secondary | ICD-10-CM | POA: Diagnosis not present

## 2022-01-25 DIAGNOSIS — Z1231 Encounter for screening mammogram for malignant neoplasm of breast: Secondary | ICD-10-CM | POA: Diagnosis not present

## 2022-01-25 DIAGNOSIS — Z01419 Encounter for gynecological examination (general) (routine) without abnormal findings: Secondary | ICD-10-CM | POA: Diagnosis not present

## 2022-01-25 DIAGNOSIS — Z124 Encounter for screening for malignant neoplasm of cervix: Secondary | ICD-10-CM | POA: Diagnosis not present

## 2022-01-26 ENCOUNTER — Other Ambulatory Visit: Payer: Self-pay | Admitting: Obstetrics and Gynecology

## 2022-01-26 DIAGNOSIS — M8580 Other specified disorders of bone density and structure, unspecified site: Secondary | ICD-10-CM

## 2022-02-21 DIAGNOSIS — Z800 Family history of malignant neoplasm of digestive organs: Secondary | ICD-10-CM | POA: Diagnosis not present

## 2022-02-21 DIAGNOSIS — Z1211 Encounter for screening for malignant neoplasm of colon: Secondary | ICD-10-CM | POA: Diagnosis not present

## 2022-02-21 DIAGNOSIS — R1310 Dysphagia, unspecified: Secondary | ICD-10-CM | POA: Diagnosis not present

## 2022-02-21 DIAGNOSIS — E785 Hyperlipidemia, unspecified: Secondary | ICD-10-CM | POA: Diagnosis not present

## 2022-05-09 DIAGNOSIS — E785 Hyperlipidemia, unspecified: Secondary | ICD-10-CM | POA: Diagnosis not present

## 2022-05-09 DIAGNOSIS — R7303 Prediabetes: Secondary | ICD-10-CM | POA: Diagnosis not present

## 2022-05-09 DIAGNOSIS — E559 Vitamin D deficiency, unspecified: Secondary | ICD-10-CM | POA: Diagnosis not present

## 2022-05-09 DIAGNOSIS — Z0000 Encounter for general adult medical examination without abnormal findings: Secondary | ICD-10-CM | POA: Diagnosis not present

## 2022-07-10 ENCOUNTER — Ambulatory Visit
Admission: RE | Admit: 2022-07-10 | Discharge: 2022-07-10 | Disposition: A | Discharge status: Home / Self Care | Payer: BC Managed Care – PPO | Source: Ambulatory Visit | Attending: Obstetrics and Gynecology | Admitting: Obstetrics and Gynecology

## 2022-07-10 DIAGNOSIS — M8580 Other specified disorders of bone density and structure, unspecified site: Secondary | ICD-10-CM

## 2022-07-10 DIAGNOSIS — N958 Other specified menopausal and perimenopausal disorders: Secondary | ICD-10-CM | POA: Diagnosis not present

## 2022-07-10 DIAGNOSIS — E349 Endocrine disorder, unspecified: Secondary | ICD-10-CM | POA: Diagnosis not present

## 2022-08-14 DIAGNOSIS — Z1211 Encounter for screening for malignant neoplasm of colon: Secondary | ICD-10-CM | POA: Diagnosis not present

## 2022-08-14 DIAGNOSIS — K635 Polyp of colon: Secondary | ICD-10-CM | POA: Diagnosis not present

## 2022-08-14 DIAGNOSIS — D123 Benign neoplasm of transverse colon: Secondary | ICD-10-CM | POA: Diagnosis not present

## 2022-08-17 DIAGNOSIS — H40023 Open angle with borderline findings, high risk, bilateral: Secondary | ICD-10-CM | POA: Diagnosis not present

## 2022-09-05 DIAGNOSIS — R7303 Prediabetes: Secondary | ICD-10-CM | POA: Diagnosis not present

## 2023-01-30 DIAGNOSIS — Z1231 Encounter for screening mammogram for malignant neoplasm of breast: Secondary | ICD-10-CM | POA: Diagnosis not present

## 2023-01-30 DIAGNOSIS — Z01419 Encounter for gynecological examination (general) (routine) without abnormal findings: Secondary | ICD-10-CM | POA: Diagnosis not present

## 2023-01-30 DIAGNOSIS — Z1331 Encounter for screening for depression: Secondary | ICD-10-CM | POA: Diagnosis not present

## 2023-01-30 DIAGNOSIS — R87810 Cervical high risk human papillomavirus (HPV) DNA test positive: Secondary | ICD-10-CM | POA: Diagnosis not present

## 2023-01-30 DIAGNOSIS — Z124 Encounter for screening for malignant neoplasm of cervix: Secondary | ICD-10-CM | POA: Diagnosis not present

## 2023-03-04 DIAGNOSIS — N390 Urinary tract infection, site not specified: Secondary | ICD-10-CM | POA: Diagnosis not present

## 2023-03-04 DIAGNOSIS — R81 Glycosuria: Secondary | ICD-10-CM | POA: Diagnosis not present

## 2023-03-04 DIAGNOSIS — R350 Frequency of micturition: Secondary | ICD-10-CM | POA: Diagnosis not present

## 2023-03-07 DIAGNOSIS — R7303 Prediabetes: Secondary | ICD-10-CM | POA: Diagnosis not present

## 2023-03-07 DIAGNOSIS — E785 Hyperlipidemia, unspecified: Secondary | ICD-10-CM | POA: Diagnosis not present

## 2023-05-16 DIAGNOSIS — E559 Vitamin D deficiency, unspecified: Secondary | ICD-10-CM | POA: Diagnosis not present

## 2023-05-16 DIAGNOSIS — Z6825 Body mass index (BMI) 25.0-25.9, adult: Secondary | ICD-10-CM | POA: Diagnosis not present

## 2023-05-16 DIAGNOSIS — E785 Hyperlipidemia, unspecified: Secondary | ICD-10-CM | POA: Diagnosis not present

## 2023-05-16 DIAGNOSIS — R7303 Prediabetes: Secondary | ICD-10-CM | POA: Diagnosis not present

## 2023-05-16 DIAGNOSIS — Z0000 Encounter for general adult medical examination without abnormal findings: Secondary | ICD-10-CM | POA: Diagnosis not present

## 2023-08-22 DIAGNOSIS — H40023 Open angle with borderline findings, high risk, bilateral: Secondary | ICD-10-CM | POA: Diagnosis not present
